# Patient Record
Sex: Male | Born: 1985 | Race: White | Hispanic: No | Smoking: Current every day smoker
Health system: Southern US, Community
[De-identification: ages and names within clinical notes are randomized; demographics above are authoritative.]

## PROBLEM LIST (undated history)

## (undated) DIAGNOSIS — R400 Somnolence: Secondary | ICD-10-CM

## (undated) DIAGNOSIS — G9050 Complex regional pain syndrome I, unspecified: Secondary | ICD-10-CM

## (undated) DIAGNOSIS — R569 Unspecified convulsions: Secondary | ICD-10-CM

---

## 2004-07-20 ENCOUNTER — Emergency Department: Payer: Self-pay | Admitting: General Practice

## 2005-09-26 ENCOUNTER — Emergency Department: Payer: Self-pay | Admitting: Emergency Medicine

## 2007-12-03 ENCOUNTER — Ambulatory Visit: Payer: Self-pay | Admitting: Internal Medicine

## 2009-01-03 ENCOUNTER — Emergency Department: Payer: Self-pay | Admitting: Emergency Medicine

## 2010-01-03 ENCOUNTER — Ambulatory Visit: Payer: Self-pay | Admitting: Pain Medicine

## 2010-01-28 ENCOUNTER — Ambulatory Visit: Payer: Self-pay | Admitting: Pain Medicine

## 2010-02-18 ENCOUNTER — Ambulatory Visit: Payer: Self-pay | Admitting: Pain Medicine

## 2010-03-19 ENCOUNTER — Ambulatory Visit: Payer: Self-pay | Admitting: Family Medicine

## 2010-03-21 ENCOUNTER — Ambulatory Visit: Payer: Self-pay | Admitting: Pain Medicine

## 2013-04-06 ENCOUNTER — Emergency Department: Payer: Self-pay | Admitting: Internal Medicine

## 2013-04-06 LAB — COMPREHENSIVE METABOLIC PANEL
Albumin: 4.2 g/dL (ref 3.4–5.0)
Alkaline Phosphatase: 99 U/L (ref 50–136)
Anion Gap: 7 (ref 7–16)
Calcium, Total: 9.5 mg/dL (ref 8.5–10.1)
Chloride: 103 mmol/L (ref 98–107)
Glucose: 87 mg/dL (ref 65–99)
Osmolality: 276 (ref 275–301)
Potassium: 3.7 mmol/L (ref 3.5–5.1)
SGPT (ALT): 27 U/L (ref 12–78)
Sodium: 137 mmol/L (ref 136–145)

## 2013-04-06 LAB — CBC
HCT: 44.9 % (ref 40.0–52.0)
MCHC: 34.3 g/dL (ref 32.0–36.0)
MCV: 85 fL (ref 80–100)
Platelet: 239 10*3/uL (ref 150–440)
RBC: 5.27 10*6/uL (ref 4.40–5.90)
RDW: 13.8 % (ref 11.5–14.5)
WBC: 14.9 10*3/uL — ABNORMAL HIGH (ref 3.8–10.6)

## 2013-05-19 ENCOUNTER — Emergency Department: Payer: Self-pay | Admitting: Emergency Medicine

## 2013-05-19 LAB — URINALYSIS, COMPLETE
Blood: NEGATIVE
Ketone: NEGATIVE
Nitrite: NEGATIVE
Ph: 7 (ref 4.5–8.0)
WBC UR: NONE SEEN /HPF (ref 0–5)

## 2013-05-19 LAB — DRUG SCREEN, URINE
Amphetamines, Ur Screen: NEGATIVE (ref ?–1000)
Cocaine Metabolite,Ur ~~LOC~~: NEGATIVE (ref ?–300)
Methadone, Ur Screen: NEGATIVE (ref ?–300)
Opiate, Ur Screen: POSITIVE (ref ?–300)
Phencyclidine (PCP) Ur S: NEGATIVE (ref ?–25)
Tricyclic, Ur Screen: NEGATIVE (ref ?–1000)

## 2013-05-19 LAB — COMPREHENSIVE METABOLIC PANEL
Albumin: 3.7 g/dL (ref 3.4–5.0)
Alkaline Phosphatase: 109 U/L (ref 50–136)
Creatinine: 1 mg/dL (ref 0.60–1.30)
Osmolality: 277 (ref 275–301)
SGPT (ALT): 27 U/L (ref 12–78)
Total Protein: 7.3 g/dL (ref 6.4–8.2)

## 2013-05-19 LAB — CBC
HCT: 43 % (ref 40.0–52.0)
HGB: 14.5 g/dL (ref 13.0–18.0)
MCHC: 33.8 g/dL (ref 32.0–36.0)
MCV: 85 fL (ref 80–100)
RBC: 5.05 10*6/uL (ref 4.40–5.90)
RDW: 14.2 % (ref 11.5–14.5)
WBC: 14.6 10*3/uL — ABNORMAL HIGH (ref 3.8–10.6)

## 2013-05-19 LAB — TSH: Thyroid Stimulating Horm: 0.24 u[IU]/mL — ABNORMAL LOW

## 2013-05-19 LAB — ETHANOL: Ethanol: <3 mg/dL

## 2013-11-18 ENCOUNTER — Encounter (HOSPITAL_COMMUNITY): Payer: Self-pay | Admitting: Emergency Medicine

## 2013-11-18 ENCOUNTER — Emergency Department (HOSPITAL_COMMUNITY)
Admission: EM | Admit: 2013-11-18 | Discharge: 2013-11-18 | Disposition: A | Discharge status: Home / Self Care | Payer: Self-pay | Attending: Emergency Medicine | Admitting: Emergency Medicine

## 2013-11-18 DIAGNOSIS — F17200 Nicotine dependence, unspecified, uncomplicated: Secondary | ICD-10-CM | POA: Insufficient documentation

## 2013-11-18 DIAGNOSIS — K0889 Other specified disorders of teeth and supporting structures: Secondary | ICD-10-CM

## 2013-11-18 DIAGNOSIS — IMO0001 Reserved for inherently not codable concepts without codable children: Secondary | ICD-10-CM | POA: Insufficient documentation

## 2013-11-18 DIAGNOSIS — K089 Disorder of teeth and supporting structures, unspecified: Secondary | ICD-10-CM | POA: Insufficient documentation

## 2013-11-18 DIAGNOSIS — Z8669 Personal history of other diseases of the nervous system and sense organs: Secondary | ICD-10-CM | POA: Insufficient documentation

## 2013-11-18 HISTORY — DX: Complex regional pain syndrome I, unspecified: G90.50

## 2013-11-18 MED ORDER — AMOXICILLIN 500 MG PO CAPS: 500.0000 mg | ORAL_CAPSULE | Freq: Three times a day (TID) | ORAL | Status: DC

## 2013-11-18 MED ORDER — MELOXICAM 7.5 MG PO TABS: ORAL_TABLET | ORAL | Status: DC

## 2013-11-18 MED ORDER — ACETAMINOPHEN-CODEINE #3 300-30 MG PO TABS: 1.0000 | ORAL_TABLET | Freq: Four times a day (QID) | ORAL | Status: DC | PRN

## 2013-11-18 NOTE — ED Provider Notes (Signed)
Medical screening examination/treatment/procedure(s) were performed by non-physician practitioner and as supervising physician I was immediately available for consultation/collaboration.   EKG Interpretation None        Dilia Alemany, MD 11/18/13 2324 

## 2013-11-18 NOTE — ED Notes (Signed)
Pt asleep on stretcher.

## 2013-11-18 NOTE — ED Notes (Signed)
Dental pain to right and left sides.

## 2013-11-18 NOTE — ED Provider Notes (Signed)
CSN: [id number]     Arrival date & time 11/18/13  1504 History   First MD Initiated Contact with Patient 11/18/13 1540     Chief Complaint  Patient presents with  . Dental Pain     (Consider location/radiation/quality/duration/timing/severity/associated sxs/prior Treatment) HPI Comments: Patient is a 28 year old male who presents to the emergency department with a complaint of right and left side dental pain. The patient states he has been dealing with this for some time. He has been seen and evaluated by an oral surgeon.he has not been able to get this work done because he does not have Secretary/administratordental insurance for another month. He does have an appointment with an oral surgeon in about a month. He has been treated with antibiotics and a pain medication that he does not remember the name of. He states that the pain medicine did not do any good. He has not had fever.   The history is provided by the patient.    Past Medical History  Diagnosis Date  . RSD (reflex sympathetic dystrophy)    History reviewed. No pertinent past surgical history. No family history on file. History  Substance Use Topics  . Smoking status: Current Every Day Smoker    Types: Cigarettes  . Smokeless tobacco: Not on file  . Alcohol Use: Yes     Comment: occ    Review of Systems  Constitutional: Negative for activity change.       All ROS Neg except as noted in HPI  HENT: Positive for dental problem. Negative for nosebleeds.   Eyes: Negative for photophobia and discharge.  Respiratory: Negative for cough, shortness of breath and wheezing.   Cardiovascular: Negative for chest pain and palpitations.  Gastrointestinal: Negative for abdominal pain and blood in stool.  Genitourinary: Negative for dysuria, frequency and hematuria.  Musculoskeletal: Positive for myalgias. Negative for arthralgias, back pain and neck pain.  Skin: Negative.   Neurological: Negative for dizziness, seizures and speech difficulty.   Psychiatric/Behavioral: Negative for hallucinations and confusion.      Allergies  Review of patient's allergies indicates no known allergies.  Home Medications   Prior to Admission medications   Not on File   BP 178/81  Pulse 96  Temp(Src) 97.9 F (36.6 C) (Oral)  Resp 22  Ht 5\' 11"  (1.803 m)  Wt 205 lb (92.987 kg)  BMI 28.60 kg/m2  SpO2 100% Physical Exam  Nursing note and vitals reviewed. Constitutional: He is oriented to person, place, and time. He appears well-developed and well-nourished.  Non-toxic appearance.  HENT:  Head: Normocephalic.  Right Ear: Tympanic membrane and external ear normal.  Left Ear: Tympanic membrane and external ear normal.  There is pain to percussion of the molars on the right and left. There is no visible abscess appreciated. There is no swelling under the tongue. The airway is patent.  Eyes: EOM and lids are normal. Pupils are equal, round, and reactive to light.  Neck: Normal range of motion. Neck supple. Carotid bruit is not present.  Cardiovascular: Normal rate, regular rhythm, normal heart sounds, intact distal pulses and normal pulses.   Pulmonary/Chest: Breath sounds normal. No respiratory distress.  Abdominal: Soft. Bowel sounds are normal. There is no tenderness. There is no guarding.  Musculoskeletal: Normal range of motion.  Lymphadenopathy:       Head (right side): No submandibular adenopathy present.       Head (left side): No submandibular adenopathy present.    He has no cervical adenopathy.  Neurological: He is alert and oriented to person, place, and time. He has normal strength. No cranial nerve deficit or sensory deficit.  Skin: Skin is warm and dry.  Psychiatric: He has a normal mood and affect. His speech is normal.    ED Course  Procedures (including critical care time) Labs Review Labs Reviewed - No data to display  Imaging Review No results found.   EKG Interpretation None      MDM Patient presents to  the emergency department with complaint of right and left dental pain. No fever reported. The examination does not show any abscess. There is pain however to percussion of the molars on the right and left. The vital signs are well within normal limits. The pulse oximetry is 100% on room air. There is no evidence for Ludwig's angina.  The patient is treated with Amoxil, Mobic, Tylenol codeine. He is advised to see his oral surgeon as soon as possible.   Final diagnoses:  None    *I have reviewed nursing notes, vital signs, and all appropriate lab and imaging results for this patient.Kathie Dike**    Rodrigues Urbanek M Becci Batty, PA-C 11/18/13 [phone]

## 2013-11-18 NOTE — Discharge Instructions (Signed)
Dental Pain °Toothache is pain in or around a tooth. It may get worse with chewing or with cold or heat.  °HOME CARE °· Your dentist may use a numbing medicine during treatment. If so, you may need to avoid eating until the medicine wears off. Ask your dentist about this. °· Only take medicine as told by your dentist or doctor. °· Avoid chewing food near the painful tooth until after all treatment is done. Ask your dentist about this. °GET HELP RIGHT AWAY IF:  °· The problem gets worse or new problems appear. °· You have a fever. °· There is redness and puffiness (swelling) of the face, jaw, or neck. °· You cannot open your mouth. °· There is pain in the jaw. °· There is very bad pain that is not helped by medicine. °MAKE SURE YOU:  °· Understand these instructions. °· Will watch your condition. °· Will get help right away if you are not doing well or get worse. °Document Released: 12/10/2007 Document Revised: 09/15/2011 Document Reviewed: 12/10/2007 °[employer]® Patient Information ©2014 [employer]. ° °

## 2016-02-21 ENCOUNTER — Emergency Department (HOSPITAL_COMMUNITY): Payer: Self-pay

## 2016-02-21 ENCOUNTER — Emergency Department (HOSPITAL_COMMUNITY)
Admission: EM | Admit: 2016-02-21 | Discharge: 2016-02-21 | Disposition: A | Discharge status: Home / Self Care | Payer: Self-pay | Attending: Emergency Medicine | Admitting: Emergency Medicine

## 2016-02-21 ENCOUNTER — Encounter (HOSPITAL_COMMUNITY): Payer: Self-pay

## 2016-02-21 DIAGNOSIS — F17210 Nicotine dependence, cigarettes, uncomplicated: Secondary | ICD-10-CM | POA: Insufficient documentation

## 2016-02-21 DIAGNOSIS — K122 Cellulitis and abscess of mouth: Secondary | ICD-10-CM | POA: Insufficient documentation

## 2016-02-21 DIAGNOSIS — K047 Periapical abscess without sinus: Secondary | ICD-10-CM | POA: Insufficient documentation

## 2016-02-21 DIAGNOSIS — L0390 Cellulitis, unspecified: Secondary | ICD-10-CM

## 2016-02-21 DIAGNOSIS — Z79899 Other long term (current) drug therapy: Secondary | ICD-10-CM | POA: Insufficient documentation

## 2016-02-21 DIAGNOSIS — Z791 Long term (current) use of non-steroidal anti-inflammatories (NSAID): Secondary | ICD-10-CM | POA: Insufficient documentation

## 2016-02-21 DIAGNOSIS — L0291 Cutaneous abscess, unspecified: Secondary | ICD-10-CM

## 2016-02-21 LAB — I-STAT CHEM 8, ED
BUN: 16 mg/dL (ref 6–20)
Calcium, Ion: 1.15 mmol/L (ref 1.13–1.30)
Chloride: 100 mmol/L — ABNORMAL LOW (ref 101–111)
Creatinine, Ser: 1 mg/dL (ref 0.61–1.24)
Glucose, Bld: 91 mg/dL (ref 65–99)
HEMATOCRIT: 44 % (ref 39.0–52.0)
Hemoglobin: 15 g/dL (ref 13.0–17.0)
POTASSIUM: 3.7 mmol/L (ref 3.5–5.1)
Sodium: 139 mmol/L (ref 135–145)
TCO2: 26 mmol/L (ref 0–100)

## 2016-02-21 MED ORDER — IBUPROFEN 800 MG PO TABS: 800.0000 mg | ORAL_TABLET | Freq: Three times a day (TID) | ORAL | 0 refills | Status: DC

## 2016-02-21 MED ORDER — HYDROMORPHONE HCL 1 MG/ML IJ SOLN
1.0000 mg | Freq: Once | INTRAMUSCULAR | Status: AC
Start: 2016-02-21 — End: 2016-02-21
  Administered 2016-02-21: 1 mg via INTRAVENOUS
  Filled 2016-02-21: qty 1

## 2016-02-21 MED ORDER — SODIUM CHLORIDE 0.9 % IV BOLUS (SEPSIS)
1000.0000 mL | Freq: Once | INTRAVENOUS | Status: AC
  Administered 2016-02-21: 1000 mL via INTRAVENOUS

## 2016-02-21 MED ORDER — KETOROLAC TROMETHAMINE 30 MG/ML IJ SOLN
30.0000 mg | Freq: Once | INTRAMUSCULAR | Status: AC
  Administered 2016-02-21: 30 mg via INTRAVENOUS
  Filled 2016-02-21: qty 1

## 2016-02-21 MED ORDER — CLINDAMYCIN HCL 300 MG PO CAPS: 300.0000 mg | ORAL_CAPSULE | Freq: Four times a day (QID) | ORAL | 0 refills | Status: DC

## 2016-02-21 MED ORDER — FENTANYL CITRATE (PF) 100 MCG/2ML IJ SOLN
100.0000 ug | Freq: Once | INTRAMUSCULAR | Status: AC
  Administered 2016-02-21: 100 ug via INTRAVENOUS
  Filled 2016-02-21: qty 2

## 2016-02-21 MED ORDER — CLINDAMYCIN PHOSPHATE 600 MG/50ML IV SOLN
600.0000 mg | Freq: Once | INTRAVENOUS | Status: AC
  Administered 2016-02-21: 600 mg via INTRAVENOUS
  Filled 2016-02-21: qty 50

## 2016-02-21 MED ORDER — CLINDAMYCIN HCL 150 MG PO CAPS
300.0000 mg | ORAL_CAPSULE | Freq: Once | ORAL | Status: AC
  Administered 2016-02-21: 300 mg via ORAL
  Filled 2016-02-21: qty 2

## 2016-02-21 MED ORDER — FENTANYL CITRATE (PF) 100 MCG/2ML IJ SOLN
100.0000 ug | Freq: Once | INTRAMUSCULAR | Status: AC
Start: 2016-02-21 — End: 2016-02-21
  Administered 2016-02-21: 100 ug via INTRAVENOUS
  Filled 2016-02-21: qty 2

## 2016-02-21 MED ORDER — IOPAMIDOL (ISOVUE-300) INJECTION 61%
75.0000 mL | Freq: Once | INTRAVENOUS | Status: AC | PRN
  Administered 2016-02-21: 75 mL via INTRAVENOUS

## 2016-02-21 MED ORDER — BENZOCAINE 20 % MT AERO
INHALATION_SPRAY | Freq: Once | OROMUCOSAL | Status: AC
  Administered 2016-02-21: 18:00:00 via OROMUCOSAL
  Filled 2016-02-21: qty 57

## 2016-02-21 NOTE — ED Triage Notes (Signed)
Left lower dental pain since this morning. Swelling noted to left side of face/neck.

## 2016-02-21 NOTE — ED Notes (Signed)
Pt wants additional pain medication and EDP notified.

## 2016-02-22 ENCOUNTER — Telehealth: Payer: Self-pay | Admitting: *Deleted

## 2016-02-22 NOTE — ED Provider Notes (Signed)
[facility]EM[facility] DEPT Provider Note   CSN: [id number] Arrival date & time: 02/21/16  1619     History   Chief Complaint Chief Complaint  Patient presents with  . Dental Pain    HPI Roberto Glover is a 30 y.o. male.   Dental Pain   This is a recurrent problem. The current episode started more than 2 days ago. The problem occurs constantly. The problem has been gradually worsening. The pain is at a severity of 9/10. The pain is severe. He has tried acetaminophen for the symptoms. The treatment provided mild relief.    Past Medical History:  Diagnosis Date  . RSD (reflex sympathetic dystrophy)     There are no active problems to display for this patient.   History reviewed. No pertinent surgical history.     Home Medications    Prior to Admission medications   Medication Sig Start Date End Date Taking? Authorizing Provider  clindamycin (CLEOCIN) 300 MG capsule Take 1 capsule (300 mg total) by mouth 4 (four) times daily. X 7 days 02/21/16   Marily MemosJason Zylpha Poynor, MD  ibuprofen (ADVIL,MOTRIN) 800 MG tablet Take 1 tablet (800 mg total) by mouth 3 (three) times daily. 02/21/16   Marily MemosJason Elmer Merwin, MD    Family History No family history on file.  Social History Social History  Substance Use Topics  . Smoking status: Current Every Day Smoker    Packs/day: 0.50    Types: Cigarettes  . Smokeless tobacco: Never Used  . Alcohol use Yes     Comment: occ     Allergies   Review of patient's allergies indicates no known allergies.   Review of Systems Review of Systems  Constitutional: Negative for chills and fever.  HENT: Positive for dental problem and facial swelling. Negative for ear pain.   Respiratory: Negative for cough and shortness of breath.   Endocrine: Negative for polydipsia and polyuria.  All other systems reviewed and are negative.    Physical Exam Updated Vital Signs BP 134/89   Pulse 100   Temp 99.4 F (37.4 C) (Oral)   Resp 20   Ht 6' (1.829 m)   Wt 180  lb (81.6 kg)   SpO2 99%   BMI 24.41 kg/m   Physical Exam  Constitutional: He appears well-developed and well-nourished.  HENT:  Head: Normocephalic and atraumatic.  Mouth/Throat: Dental abscesses (around left lower premolars) and dental caries present.    Eyes: Conjunctivae are normal.  Neck: Neck supple.  Cardiovascular: Normal rate and regular rhythm.   No murmur heard. Pulmonary/Chest: Effort normal and breath sounds normal. No respiratory distress.  Abdominal: Soft. There is no tenderness.  Musculoskeletal: He exhibits no edema.  Neurological: He is alert.  Skin: Skin is warm and dry.  Psychiatric: He has a normal mood and affect.  Nursing note and vitals reviewed.    ED Treatments / Results  Labs (all labs ordered are listed, but only abnormal results are displayed) Labs Reviewed  I-STAT CHEM 8, ED - Abnormal; Notable for the following:       Result Value   Chloride 100 (*)    All other components within normal limits    EKG  EKG Interpretation None       Radiology Ct Soft Tissue Neck W Contrast  Result Date: 02/21/2016 CLINICAL DATA:  LEFT lower dental pain beginning this morning, LEFT facial and neck swelling. Evaluate for extent of abscess. History of reflex sympathetic dystrophy. EXAM: CT NECK WITH CONTRAST CT MAXILLOFACIAL WITH  CONTRAST TECHNIQUE: Multidetector CT imaging of the maxillofacial and neck was performed using the standard protocol following the bolus administration of intravenous contrast. CONTRAST:  75mL ISOVUE-300 IOPAMIDOL (ISOVUE-300) INJECTION 61% COMPARISON:  None. FINDINGS: CT NECK: Pharynx and larynx: Prominent palatine tonsils compatible hypertrophy without inflammatory changes. Preservation of parapharyngeal fat tissue planes. Normal appearance of the larynx. Airway widely patent. Salivary glands: Normal. Thyroid: Normal. Lymph nodes: Multiple LEFT level 1B reniform homogeneously enhancing lymph nodes measure up to 10 mm short access  without pathologic lymphadenopathy by CT size criteria. Vascular: Normal. Skeleton: Broad reversed cervical lordosis. No destructive bony lesions. Upper chest: Lung apices are clear. No superior mediastinal lymphadenopathy. CT MAXILLOFACIAL: FACIAL BONES: Periapical lucencies teeth seventeenth, eighteenth, nineteenth (LEFT mandible molars) with multiple dental caries. Additional RIGHT maxillary molar periapical abscess. The mandible is intact, the condyles are located. No acute facial fracture. SINUSES: Paranasal sinuses are well aerated. Nasal septum is midline. ORBITS: Ocular globes and orbital contents are unremarkable. SOFT TISSUES: 5 x 20 x 18 mm (transverse x AP x CC) LEFT buccal rim enhancing fluid collection. Overlying LEFT facial and upper neck soft tissue swelling, an thickened LEFT platysma. No subcutaneous gas or radiopaque foreign bodies. IMPRESSION: CT MAXILLOFACIAL: 5 x 20 x 18 mm LEFT lower facial abscess and cellulitis, likely odontogenic in origin considering subjacent periapical abscess. Generalized poor dentition. CT NECK: Prominent though not pathologically enlarged LEFT level 1 B/submandibular lymph nodes are likely reactive. Symmetric palatine hypertrophy without CT findings of P2 tonsillitis. Electronically Signed   By: Awilda Metro M.D.   On: 02/21/2016 19:43   Ct Maxillofacial W Contrast  Result Date: 02/21/2016 CLINICAL DATA:  LEFT lower dental pain beginning this morning, LEFT facial and neck swelling. Evaluate for extent of abscess. History of reflex sympathetic dystrophy. EXAM: CT NECK WITH CONTRAST CT MAXILLOFACIAL WITH CONTRAST TECHNIQUE: Multidetector CT imaging of the maxillofacial and neck was performed using the standard protocol following the bolus administration of intravenous contrast. CONTRAST:  75mL ISOVUE-300 IOPAMIDOL (ISOVUE-300) INJECTION 61% COMPARISON:  None. FINDINGS: CT NECK: Pharynx and larynx: Prominent palatine tonsils compatible hypertrophy without  inflammatory changes. Preservation of parapharyngeal fat tissue planes. Normal appearance of the larynx. Airway widely patent. Salivary glands: Normal. Thyroid: Normal. Lymph nodes: Multiple LEFT level 1B reniform homogeneously enhancing lymph nodes measure up to 10 mm short access without pathologic lymphadenopathy by CT size criteria. Vascular: Normal. Skeleton: Broad reversed cervical lordosis. No destructive bony lesions. Upper chest: Lung apices are clear. No superior mediastinal lymphadenopathy. CT MAXILLOFACIAL: FACIAL BONES: Periapical lucencies teeth seventeenth, eighteenth, nineteenth (LEFT mandible molars) with multiple dental caries. Additional RIGHT maxillary molar periapical abscess. The mandible is intact, the condyles are located. No acute facial fracture. SINUSES: Paranasal sinuses are well aerated. Nasal septum is midline. ORBITS: Ocular globes and orbital contents are unremarkable. SOFT TISSUES: 5 x 20 x 18 mm (transverse x AP x CC) LEFT buccal rim enhancing fluid collection. Overlying LEFT facial and upper neck soft tissue swelling, an thickened LEFT platysma. No subcutaneous gas or radiopaque foreign bodies. IMPRESSION: CT MAXILLOFACIAL: 5 x 20 x 18 mm LEFT lower facial abscess and cellulitis, likely odontogenic in origin considering subjacent periapical abscess. Generalized poor dentition. CT NECK: Prominent though not pathologically enlarged LEFT level 1 B/submandibular lymph nodes are likely reactive. Symmetric palatine hypertrophy without CT findings of P2 tonsillitis. Electronically Signed   By: Awilda Metro M.D.   On: 02/21/2016 19:43    Procedures .Marland KitchenIncision and Drainage Date/Time: 02/22/2016 12:26 AM Performed by: Marily Memos  Authorized by: Marily MemosMESNER, Nels Munn   Consent:    Consent obtained:  Verbal   Consent given by:  Patient   Risks discussed:  Bleeding, incomplete drainage, pain and infection   Alternatives discussed:  No treatment, delayed treatment, observation and  alternative treatment Location:    Type:  Abscess   Size:  1.5   Location:  Mouth Procedure type:    Complexity:  Simple Procedure details:    Needle aspiration: no     Incision types:  Single straight   Scalpel blade:  11   Wound management:  Irrigated with saline   Drainage:  Bloody and purulent   Drainage amount:  Moderate   Wound treatment:  Wound left open   Packing materials:  None Post-procedure details:    Patient tolerance of procedure:  Tolerated well, no immediate complications   (including critical care time)  Medications Ordered in ED Medications  clindamycin (CLEOCIN) IVPB 600 mg (0 mg Intravenous Stopped 02/21/16 1852)  HYDROmorphone (DILAUDID) injection 1 mg (1 mg Intravenous Given 02/21/16 1822)  Benzocaine (HURRCAINE) 20 % mouth spray ( Mouth/Throat Given 02/21/16 1822)  sodium chloride 0.9 % bolus 1,000 mL (0 mLs Intravenous Stopped 02/21/16 2005)  iopamidol (ISOVUE-300) 61 % injection 75 mL (75 mLs Intravenous Contrast Given 02/21/16 1905)  fentaNYL (SUBLIMAZE) injection 100 mcg (100 mcg Intravenous Given 02/21/16 1948)  ketorolac (TORADOL) 30 MG/ML injection 30 mg (30 mg Intravenous Given 02/21/16 2142)  clindamycin (CLEOCIN) capsule 300 mg (300 mg Oral Given 02/21/16 2142)  fentaNYL (SUBLIMAZE) injection 100 mcg (100 mcg Intravenous Given 02/21/16 2142)     Initial Impression / Assessment and Plan / ED Course  I have reviewed the triage vital signs and the nursing notes.  Pertinent labs & imaging results that were available during my care of the patient were reviewed by me and considered in my medical decision making (see chart for details).  Clinical Course    Dental infection and abscess as above. Also with some cheek/neck swelling so ct done and infection likely extends to neck. I&D done as above. Plan for dentistry follow up in the AM.   Final Clinical Impressions(s) / ED Diagnoses   Final diagnoses:  Cellulitis and abscess  Dental abscess    New  Prescriptions Discharge Medication List as of 02/21/2016  9:38 PM    START taking these medications   Details  clindamycin (CLEOCIN) 300 MG capsule Take 1 capsule (300 mg total) by mouth 4 (four) times daily. X 7 days, Starting Thu 02/21/2016, Print         Marily MemosJason Caylyn Tedeschi, MD 02/22/16 [id number]

## 2017-07-26 ENCOUNTER — Emergency Department (HOSPITAL_COMMUNITY): Payer: Self-pay

## 2017-07-26 ENCOUNTER — Encounter (HOSPITAL_COMMUNITY): Payer: Self-pay | Admitting: Emergency Medicine

## 2017-07-26 ENCOUNTER — Other Ambulatory Visit: Payer: Self-pay

## 2017-07-26 ENCOUNTER — Emergency Department (HOSPITAL_COMMUNITY)
Admission: EM | Admit: 2017-07-26 | Discharge: 2017-07-26 | Disposition: A | Discharge status: Home / Self Care | Payer: Self-pay | Attending: Emergency Medicine | Admitting: Emergency Medicine

## 2017-07-26 DIAGNOSIS — F17210 Nicotine dependence, cigarettes, uncomplicated: Secondary | ICD-10-CM | POA: Insufficient documentation

## 2017-07-26 DIAGNOSIS — R569 Unspecified convulsions: Secondary | ICD-10-CM

## 2017-07-26 HISTORY — DX: Unspecified convulsions: R56.9

## 2017-07-26 LAB — CBC WITH DIFFERENTIAL/PLATELET
BASOS ABS: 0 10*3/uL (ref 0.0–0.1)
Basophils Relative: 0 %
Eosinophils Absolute: 0.3 10*3/uL (ref 0.0–0.7)
Eosinophils Relative: 4 %
HEMATOCRIT: 42.1 % (ref 39.0–52.0)
HEMOGLOBIN: 14.4 g/dL (ref 13.0–17.0)
LYMPHS ABS: 3 10*3/uL (ref 0.7–4.0)
LYMPHS PCT: 32 %
MCH: 29.6 pg (ref 26.0–34.0)
MCHC: 34.2 g/dL (ref 30.0–36.0)
MCV: 86.6 fL (ref 78.0–100.0)
Monocytes Absolute: 0.6 10*3/uL (ref 0.1–1.0)
Monocytes Relative: 6 %
NEUTROS ABS: 5.5 10*3/uL (ref 1.7–7.7)
Neutrophils Relative %: 58 %
Platelets: 213 10*3/uL (ref 150–400)
RBC: 4.86 MIL/uL (ref 4.22–5.81)
RDW: 14.2 % (ref 11.5–15.5)
WBC: 9.5 10*3/uL (ref 4.0–10.5)

## 2017-07-26 LAB — COMPREHENSIVE METABOLIC PANEL
ALK PHOS: 73 U/L (ref 38–126)
ALT: 16 U/L — AB (ref 17–63)
AST: 23 U/L (ref 15–41)
Albumin: 3.6 g/dL (ref 3.5–5.0)
Anion gap: 10 (ref 5–15)
BUN: 11 mg/dL (ref 6–20)
CHLORIDE: 105 mmol/L (ref 101–111)
CO2: 23 mmol/L (ref 22–32)
Calcium: 8.5 mg/dL — ABNORMAL LOW (ref 8.9–10.3)
Creatinine, Ser: 1.14 mg/dL (ref 0.61–1.24)
Glucose, Bld: 115 mg/dL — ABNORMAL HIGH (ref 65–99)
Potassium: 3.5 mmol/L (ref 3.5–5.1)
Sodium: 138 mmol/L (ref 135–145)
Total Bilirubin: 0.5 mg/dL (ref 0.3–1.2)
Total Protein: 6.4 g/dL — ABNORMAL LOW (ref 6.5–8.1)

## 2017-07-26 LAB — CBG MONITORING, ED: GLUCOSE-CAPILLARY: 80 mg/dL (ref 65–99)

## 2017-07-26 LAB — RAPID URINE DRUG SCREEN, HOSP PERFORMED
Amphetamines: NOT DETECTED
Barbiturates: NOT DETECTED
Benzodiazepines: POSITIVE — AB
Cocaine: POSITIVE — AB
OPIATES: POSITIVE — AB
TETRAHYDROCANNABINOL: POSITIVE — AB

## 2017-07-26 LAB — I-STAT TROPONIN, ED: TROPONIN I, POC: 0 ng/mL (ref 0.00–0.08)

## 2017-07-26 LAB — ETHANOL: Alcohol, Ethyl (B): <10 mg/dL (ref <10)

## 2017-07-26 MED ORDER — LEVETIRACETAM 500 MG/5ML IV SOLN
1000.0000 mg | Freq: Once | INTRAVENOUS | Status: AC
  Administered 2017-07-26: 1000 mg via INTRAVENOUS
  Filled 2017-07-26: qty 10

## 2017-07-26 MED ORDER — LORAZEPAM 2 MG/ML IJ SOLN: 1.0000 mg | Freq: Once | INTRAMUSCULAR | Status: DC

## 2017-07-26 MED ORDER — SODIUM CHLORIDE 0.9 % IV SOLN
500.0000 mg | Freq: Once | INTRAVENOUS | Status: AC
  Administered 2017-07-26: 500 mg via INTRAVENOUS
  Filled 2017-07-26: qty 5

## 2017-07-26 MED ORDER — GADOBENATE DIMEGLUMINE 529 MG/ML IV SOLN
15.0000 mL | Freq: Once | INTRAVENOUS | Status: AC | PRN
  Administered 2017-07-26: 15 mL via INTRAVENOUS

## 2017-07-26 MED ORDER — SODIUM CHLORIDE 0.9 % IV BOLUS (SEPSIS)
1000.0000 mL | Freq: Once | INTRAVENOUS | Status: AC
  Administered 2017-07-26: 1000 mL via INTRAVENOUS

## 2017-07-26 MED ORDER — LEVETIRACETAM 750 MG PO TABS: 750.0000 mg | ORAL_TABLET | Freq: Two times a day (BID) | ORAL | 0 refills | Status: DC

## 2017-07-26 NOTE — Consult Note (Signed)
Neurology Consultation Reason for Consult: Seizure Referring Physician: Dr Elesa Massed  CC: Seizure  History is obtained from: Patient  HPI: Roberto Glover is a 32 y.o. male with PMH of seizures. He presents after girlfriend witnessed generalized tonic clonic seizure tonight. Had 1 GCTS 8 days ago witnessed by coworker, and another 2 days ago. Had seizures 6 years ago and was on phenobarbital, but later discontinued it due to drowsiness.  Denies alcohol abuse, admits to using marijuana and took a Percocet yesterday. Was prevously seen by Neurologist in [place], [place] but cannot recall his name.  Patient is back to baseline in ER. Exam shows mild reduced sensation on right arm and leg.  Son has epilepsy, has had multiple concussions.     ROS: A 14 point ROS was performed and is negative except as noted in the HPI.   Past Medical History:  Diagnosis Date  . RSD (reflex sympathetic dystrophy)   . Seizures (HCC)      No family history on file.   Social History:  reports that he has been smoking cigarettes.  He has been smoking about 0.50 packs per day. he has never used smokeless tobacco. He reports that he drinks alcohol. He reports that he does not use drugs.   Exam: Current vital signs: BP (!) 122/93   Pulse 63   Temp 97.8 F (36.6 C) (Axillary)   Resp 20   SpO2 97%  Vital signs in last 24 hours: Temp:  [97.8 F (36.6 C)] 97.8 F (36.6 C) (01/20 0041) Pulse Rate:  [58-77] 63 (01/20 0600) Resp:  [10-20] 20 (01/20 0600) BP: (111-135)/(70-95) 122/93 (01/20 0600) SpO2:  [94 %-100 %] 97 % (01/20 0600)   Physical Exam  Constitutional: Appears well-developed and well-nourished.  Psych: Affect appropriate to situation Eyes: No scleral injection HENT: No OP obstrucion Head: Normocephalic.  Cardiovascular: Normal rate and regular rhythm.  Respiratory: Effort normal, non-labored breathing GI: Soft.  No distension. There is no tenderness.  Skin: WDI  Neuro: Mental  Status: Patient is awake, alert, oriented to person, place, month, year, and situation. Patient is able to give a clear and coherent history. No signs of aphasia or neglect Cranial Nerves: II: Visual Fields are full. Pupils are equal, round, and reactive to light.   III,IV, VI: EOMI without ptosis or diploplia.  V: Facial sensation is symmetric to temperature VII: Facial movement is symmetric.  VIII: hearing is intact to voice X: Uvula elevates symmetrically XI: Shoulder shrug is symmetric. XII: tongue is midline without atrophy or fasciculations.  Motor: Tone is normal. Bulk is normal. 5/5 strength was present in all four extremities.  Sensory: Reduced to temp and light touch over Right arm and leg compared to left  Deep Tendon Reflexes: 2+ and symmetric in the biceps and patellae. Plantars: Toes are downgoing bilaterally.  Cerebellar: FNF and HKS are intact bilaterally      I have reviewed labs in epic and the results pertinent to this consultation are: UDS positive for cocaine, opiates, benzo, THC  I have reviewed the images obtained:CT Head  ASSESSMENT AND  PLAN  Seizure   Has prior seizure history, unknown if they were in setting of substance abuse. Previously on Phenobarb\ UDS positive for cocaine, opiates, benzo, THC this time, patient denies using Cocaine, benzos Will start patient on Keppra 750 mg BID MRI Brain w.w contrast ordered   No driving x 6 months  F/U outpatient Neurology and EEG as outpatient   Per [facility]  statutes, patients with seizures are not allowed to drive until they have been seizure-free for six months. Use caution when using heavy equipment or power tools. Avoid working on ladders or at heights. Take showers instead of baths. Ensure the water temperature is not too high on the home water heater. Do not go swimming alone. Do not lock yourself in a room alone (i.e. bathroom). When caring for infants or small children, sit down when  holding, feeding, or changing them to minimize risk of injury to the child in the event you have a seizure. Maintain good sleep hygiene. Avoid alcohol.    If Roberto Glover has another seizure, call 911 and bring them back to the ED if:       A.  The seizure lasts longer than 5 minutes.            B.  The patient doesn't wake shortly after the seizure or has new problems such as difficulty seeing, speaking or moving following the seizure       C.  The patient was injured during the seizure       D.  The patient has a temperature over 102 F (39C)       E.  The patient vomited during the seizure and now is having trouble breathing

## 2017-07-26 NOTE — Discharge Instructions (Addendum)
As discussed, make sure to take your medication as prescribed and follow-up with neurology.  Seizure precautions -- Please avoid unsupervised activities that might pose danger with sudden loss of consciousness, including bathing, swimming alone, working at heights, and operating heavy machinery. The bathtub is the most common site of seizure-induced drowning, and patients with convulsions should be told to take showers instead of baths.  Driving -- States and countries vary in Engineer, miningdriver licensing requirements for patients with an episode of loss of consciousness, including from seizures and epilepsy, as well as the responsibilities of clinicians to notify state authorities. Most if not all require at least some period of abstinence from driving after a seizure or other event associated with loss or alteration of consciousness. Please do not drive until evaluated by neurologist and until seizure free for at least 6 months.   Stop using drugs, this will affect your seizure activity.

## 2017-07-26 NOTE — ED Provider Notes (Signed)
Patient handed off to me by previous EDPA Clovis RileyMitchell at shift change pending MRI. Please see previous note for full details. Briefly, patient is a 32 year old male with history of seizures previously on phenobarbital. Has not taken phenobarbital in several years and has not had break through seizures since. He has had 3-4 seizures in the last week. On exam by previous provider patient has decreased sensation on right face, arm and leg but strength normal. He denies illicit drug use ETOH abuse.  ED work up so far includes positive opiates, cocaine, benzodiazepines, marijuana in urine. Girlfriend unaware of drug use. Denies ETOH abuse.  On my exam, patient is AxO x 4. No tachycardia, tremors, diaphoresis, nausea. Does not look like withdrawal. Decreased strength  with right hand grip and right dorsiflexion/plantar flexion, question how lack of effort during exam. Also, reports decreased sensation on right face, arm and leg. Dr. Laurence SlateAroor evaluated pt and recommended MRI, pending.  Will attempt to ambulate.  [id number]: Spoke to Dr. Otelia LimesLindzen regarding MRI impression. He reviewed MRI and thinks finding is very subtle. I discussed my physical exam finding of decreased hand grip and dorsiflexion on the right, which was not documented by previous EDP or neurologist and is new per pt. Dr. Otelia LimesLindzen recommending PT evaluation and reassessment in an hour to determine if weakness is improving then likely Todd's paralysis. Will place PT ordered. Discussed plan with patient and is agreeable.  1245: PT evaluated patient. Recommending discharge wo further PT interventions. Gave very clear seizure precautions including no driving for 6 months and until no seizure activity. He has been ambulatory around ED while waiting for PT. No seizure activity in ED for past 12+ hours.     Liberty HandyGibbons, Shuntavia Yerby J, PA-C 07/26/17 1245    Loren RacerYelverton, David, MD 07/26/17 1537

## 2017-07-26 NOTE — ED Provider Notes (Signed)
[facility]ENC[facility] Provider Note   CSN: [id number] Arrival date & time: 07/26/17  0025     History   Chief Complaint Chief Complaint  Patient presents with  . Seizures    HPI Roberto Glover is a 32 y.o. male with past medical history significant for seizure with last seizure 6 years ago presenting via EMS with seizure-like activity witnessed by his girlfriend.  Patient has not seen a neurologist or PCP in 6 years and has discontinued his phenobarbital as he did not like the way it made him feel.  Reports that he has not had any seizures since.  He had a witnessed seizure by a coworker who found him actively seizing with foaming at the mouth and tongue biting 8 days ago.  Patient did not want his friend to call 911 and did not get evaluated.  He then had another episode at work where he was non-responsive with a blank stare momentarily 2 days ago and then had the generalized shaking witnessed by his girlfriend tonight.  He denies any trauma or injury as he was lying down to start watching a movie when it happened.  He denies any loss of bowel or bladder function, no tongue biting.  He currently complains of generalized body aches. He has been experiencing increased headaches and dizzy spells for months which tend to occur when he goes from sitting to standing up and he reports that he feels lightheaded and as if the room was spinning around him.  He also reports a history of multiple concussions with the last one in 2014.   He denies any fever, chills, alcohol or drug use.  He did have a recent upper respiratory infection. Son with epilepsy and increased stress recently.  HPI  Past Medical History:  Diagnosis Date  . RSD (reflex sympathetic dystrophy)   . Seizures (HCC)     There are no active problems to display for this patient.   History reviewed. No pertinent surgical history.     Home Medications    Prior to Admission medications   Medication  Sig Start Date End Date Taking? Authorizing Provider  cetirizine (ZYRTEC) 10 MG tablet Take 10 mg by mouth daily as needed for allergies.   Yes [provider]    Family History No family history on file.  Social History Social History   Tobacco Use  . Smoking status: Current Every Day Smoker    Packs/day: 0.50    Types: Cigarettes  . Smokeless tobacco: Never Used  Substance Use Topics  . Alcohol use: Yes    Comment: occ  . Drug use: No     Allergies   Patient has no known allergies.   Review of Systems Review of Systems  Constitutional: Negative for chills and fever.  HENT: Negative for ear pain, facial swelling, sore throat, tinnitus and trouble swallowing.   Eyes: Negative for photophobia, pain, redness and visual disturbance.  Respiratory: Negative for cough, choking, chest tightness, shortness of breath, wheezing and stridor.   Cardiovascular: Negative for chest pain, palpitations and leg swelling.  Gastrointestinal: Negative for abdominal distention, abdominal pain, diarrhea, nausea and vomiting.  Genitourinary: Negative for difficulty urinating, dysuria, flank pain, frequency and hematuria.  Musculoskeletal: Positive for arthralgias, myalgias and neck pain. Negative for back pain, gait problem, joint swelling and neck stiffness.       Chronic right knee pain  Skin: Negative for color change, pallor, rash and wound.  Neurological: Positive for seizures  and headaches. Negative for dizziness, tremors, syncope, facial asymmetry, speech difficulty, weakness, light-headedness and numbness.     Physical Exam Updated Vital Signs BP 119/81   Pulse 63   Temp 97.8 F (36.6 C) (Axillary)   Resp 12   SpO2 97%   Physical Exam  Constitutional: He is oriented to person, place, and time. He appears well-developed and well-nourished. No distress.  Afebrile, nontoxic-appearing, lying comfortably in bed no acute distress.  HENT:  Head: Normocephalic and atraumatic.    Right Ear: External ear normal.  Left Ear: External ear normal.  Mouth/Throat: Oropharynx is clear and moist. No oropharyngeal exudate.  Patient with slight ecchymosis 2 x 1 cm to the left upper forehead which has been there for a week now.  Eyes: Conjunctivae and EOM are normal. Pupils are equal, round, and reactive to light. Right eye exhibits no discharge. Left eye exhibits no discharge.  Neck: Normal range of motion. Neck supple.  No meningeal signs  Cardiovascular: Normal rate, regular rhythm, normal heart sounds and intact distal pulses.  No murmur heard. Pulmonary/Chest: Effort normal and breath sounds normal. No stridor. No respiratory distress. He has no wheezes. He has no rales. He exhibits no tenderness.  Abdominal: Soft. Bowel sounds are normal. He exhibits no distension. There is no tenderness. There is no rebound and no guarding.  Musculoskeletal: Normal range of motion. He exhibits tenderness. He exhibits no edema or deformity.  Medial aspect of the knee, chronic for a year. tender to palpation of trapezius muscle bilaterally.  No midline tenderness to palpation of the spine  Neurological: He is alert and oriented to person, place, and time. No cranial nerve deficit. He exhibits normal muscle tone. Coordination normal.  Neurologic Exam:  - Mental status: Patient is alert and cooperative. Fluent speech and words are clear. Coherent thought processes and insight is good. Patient is oriented x 4 to person, place, time and event.  - Cranial nerves:  CN III, IV, VI: pupils equally round, reactive to light both direct and conscensual. Full extra-ocular movement. CN VII : muscles of facial expression intact. CN X :  midline uvula. XI strength of sternocleidomastoid and trapezius muscles 5/5, XII: tongue is midline when protruded. - Motor: No involuntary movements. Muscle tone and bulk normal throughout. Muscle strength is 5/5 in bilateral shoulder abduction, elbow flexion and extension,  grip, hip extension, flexion, leg flexion and extension, ankle dorsiflexion and plantar flexion.  - Sensory: Proprioception, light tough sensation intact in all extremities. Patient reporting slightly decreased light touch sensation on the right when compared to the left.  Skin: Skin is warm and dry. Capillary refill takes less than 2 seconds. No rash noted. He is not diaphoretic. No erythema. No pallor.  Psychiatric: He has a normal mood and affect.  Nursing note and vitals reviewed.    ED Treatments / Results  Labs (all labs ordered are listed, but only abnormal results are displayed) Labs Reviewed  COMPREHENSIVE METABOLIC PANEL - Abnormal; Notable for the following components:      Result Value   Glucose, Bld 115 (*)    Calcium 8.5 (*)    Total Protein 6.4 (*)    ALT 16 (*)    All other components within normal limits  RAPID URINE DRUG SCREEN, HOSP PERFORMED - Abnormal; Notable for the following components:   Opiates POSITIVE (*)    Cocaine POSITIVE (*)    Benzodiazepines POSITIVE (*)    Tetrahydrocannabinol POSITIVE (*)    All other components  within normal limits  CBC WITH DIFFERENTIAL/PLATELET  ETHANOL  CBG MONITORING, ED  I-STAT TROPONIN, ED    EKG  EKG Interpretation  Date/Time:  Sunday July 26 2017 00:55:01 EST Ventricular Rate:  77 PR Interval:    QRS Duration: 88 QT Interval:  370 QTC Calculation: 419 R Axis:   81 Text Interpretation:  Sinus rhythm Consider left atrial enlargement ST elev, probable normal early repol pattern No significant change since last tracing Confirmed by Rochele Raring [phone]) on 07/26/2017 1:06:51 AM       Radiology Dg Chest 2 View  Result Date: 07/26/2017 CLINICAL DATA:  Chest pain EXAM: CHEST  2 VIEW COMPARISON:  Chest radiograph 04/06/2013 FINDINGS: The heart size and mediastinal contours are within normal limits. Both lungs are clear. The visualized skeletal structures are unremarkable. IMPRESSION: No active cardiopulmonary  disease. Electronically Signed   By: Deatra Robinson M.D.   On: 07/26/2017 04:17   Ct Head Wo Contrast  Result Date: 07/26/2017 CLINICAL DATA:  Seizure.  Abnormal neurologic exam. EXAM: CT HEAD WITHOUT CONTRAST TECHNIQUE: Contiguous axial images were obtained from the base of the skull through the vertex without intravenous contrast. COMPARISON:  3/24/7 FINDINGS: Brain: No evidence of acute infarction, hemorrhage, hydrocephalus, extra-axial collection or mass lesion/mass effect. Vascular: No hyperdense vessel or unexpected calcification. Skull: Normal. Negative for fracture or focal lesion. Sinuses/Orbits: No acute finding. Other: None. IMPRESSION: 1. Normal brain.  No acute intracranial abnormalities identified. Electronically Signed   By: Signa Kell M.D.   On: 07/26/2017 02:25    Procedures Procedures (including critical care time)  Medications Ordered in ED Medications  sodium chloride 0.9 % bolus 1,000 mL (0 mLs Intravenous Stopped 07/26/17 0306)  levETIRAcetam (KEPPRA) 1,000 mg in sodium chloride 0.9 % 100 mL IVPB (0 mg Intravenous Stopped 07/26/17 0218)  levETIRAcetam (KEPPRA) 500 mg in sodium chloride 0.9 % 100 mL IVPB (0 mg Intravenous Stopped 07/26/17 0600)     Initial Impression / Assessment and Plan / ED Course  I have reviewed the triage vital signs and the nursing notes.  Pertinent labs & imaging results that were available during my care of the patient were reviewed by me and considered in my medical decision making (see chart for details).    Patient presenting via EMS after being convinced by his girlfriend to be evaluated for witnessed seizure at home.  Patient was in bed when this occurred denies injury or trauma. No incontinence or or evidence of tongue biting on exam.   Patient has remote history of seizures and used to be on phenobarbital which he has discontinued 6 years ago due to side effects.  Patient has not been followed by neurology or PCP since.  He denies any  seizures in the last 6 years until 8 days ago.  He has had increased frequency in headaches and seizures over the last 8 days.  Two seizures in the last 3 days including tonight. Denied any alcohol or drug use but lab results show positive opiates, cocaine, benzodiazepines and cannabinoid. Patient reported significant increase in stress levels recently.  Labs otherwise unremarkable  CT head negative  Consulted Neurology and spoke to Dr. Laurence Slate who recommended another 500mg  Keppra loading dose and 750mg  BID keppra at discharge. He will be coming to see patient.  MRI ordered by neurology.  Transferred patient care at end of shift to claudia Gibbons pending MRI and Neurology recommendations.  Final Clinical Impressions(s) / ED Diagnoses   Final diagnoses:  Seizure (HCC)  ED Discharge Orders    None       Gregary Cromer 07/26/17 0756    Loren Racer, MD 08/08/17 [phone]

## 2017-07-26 NOTE — ED Notes (Signed)
Pt stable, ambulatory, and verbalizes understanding of d/c instruction.

## 2017-07-26 NOTE — ED Triage Notes (Signed)
Pt brought in EMS s/p seizure that per EMS pt girlfriend states that he had seizure like activity on and off for approx hour. Pt a&ox4 upon arrival. 16LAC BP 140/88 P82 98%RA

## 2017-07-26 NOTE — ED Notes (Signed)
ED Provider at bedside. 

## 2017-07-26 NOTE — ED Notes (Signed)
Neurologist at bedside speaking with pt.  

## 2017-07-26 NOTE — ED Notes (Signed)
Pt stable, ambulatory, and verbalizes understanding of discharge instructions.  

## 2017-07-26 NOTE — ED Notes (Signed)
Patient transported to MRI 

## 2017-07-26 NOTE — ED Provider Notes (Signed)
MSE was initiated and I personally evaluated the patient and placed orders (if any) at  12:57 AM on July 26, 2017.   32 year old male presents to the emergency department by EMS.  EMS reports seizure-like activity intermittently over the past hour.  I was called to the room by nursing due to patient appearing to go in and out of consciousness.  On my presentation to the room, patient appears mildly confused and anxious.  He is alert to self, but unable to articulate where he is or the city he is in.  He reports a history of seizure activity for which he was previously on phenobarbital.  He notes taking himself off phenobarbital 6 years ago without any seizure activity since.  Patient notes increasing seizure activity over the past 8 days.  He has had 2 seizures in the past 3 days.  Patient denies any recent fever.  He has had mild upper respiratory symptoms and cough productive of phlegm.  Originally, the patient attributed this to tobacco use.  He also notes increased stress.  Patient denies any recent alcohol use or drug use.  He has a history of opioid addiction in his early 5820s which he underwent detox for.  He reports [facility]  This included an abnormal EEG (per patient) after which time he was placed on phenobarbital.  Patient currently complaining of diffuse body aching.  He specifically complains of some chest discomfort which is worse with inspiration.  He has no focal deficits on my neurologic exam.  His speech is clear and he is answering questions appropriately.  No evidence of tongue biting or history of incontinence since increasing seizure-like activity.  Labs as well as fluids ordered for workup.  Chest x-ray ordered in the setting of pleuritic chest pain.  EKG completed by nursing.  Patient may require neurology consultation.  In the setting of seizure history with recurrent seizure episodes, a loading dose of Keppra was  ordered.  The patient's son also with hx of Epilepsy.  The patient appears stable so that the remainder of the MSE may be completed by another provider.   Antony MaduraHumes, Stillman Buenger, PA-C 07/26/17 0108    Antony MaduraHumes, Terrace [place], PA-C 07/26/17 0130    Ward, Layla MawKristen N, DO 07/26/17 [id number]

## 2017-07-26 NOTE — Evaluation (Signed)
Physical Therapy Evaluation Patient Details Name: Roberto Glover MRN: [id number] DOB: January 24, 1986 Today's Date: 07/26/2017   History of Present Illness  Presented to ED 1/20 with witnessed generalized tonic clonic seizure tonight. Reports Rt sided weakness and numbness. PMH-RSD of RUE; seizures; drug use  Clinical Impression   Patient evaluated by Physical Therapy with no further acute PT needs identified. Patient with inconsistent strength testing rt dorsiflexors (ultimately 5/5), however more consistent with decreased grip strength 3+/5. Able to ambulate without device, no foot drop, with antalgic pattern due to h/o rt knee pain. All education has been completed and the patient has no further questions. PT is signing off. Thank you for this referral.     Follow Up Recommendations No PT follow up  (Discussed with pt anticipate resolution of symptoms with time, however if grip remains weak on f/u appt with MD, he can ask for [facility] referral). Pt reports he is familiar with this process as he went for [facility].    Equipment Recommendations  None recommended by PT    Recommendations for Other Services       Precautions / Restrictions Precautions Precautions: None Restrictions Weight Bearing Restrictions: No      Mobility  Bed Mobility Overal bed mobility: Independent                Transfers Overall transfer level: Independent                  Ambulation/Gait Ambulation/Gait assistance: Supervision Ambulation Distance (Feet): 180 Feet Assistive device: None Gait Pattern/deviations: Step-through pattern;Decreased stride length;Antalgic;Wide base of support     General Gait Details: reports antalgic due to rt knee pain (h/o)  Stairs            Wheelchair Mobility    Modified Rankin (Stroke Patients Only)       Balance                                 Standardized Balance Assessment Standardized Balance  Assessment : Berg Balance Test;Dynamic Gait Index Berg Balance Test Sit to Stand: Able to stand without using hands and stabilize independently Standing Unsupported: Able to stand safely 2 minutes Sitting with Back Unsupported but Feet Supported on Floor or Stool: Able to sit safely and securely 2 minutes Stand to Sit: Sits safely with minimal use of hands Transfers: Able to transfer safely, minor use of hands Standing Unsupported with Eyes Closed: Able to stand 10 seconds with supervision(slight posterior bias, but self recovers) Standing Ubsupported with Feet Together: Able to place feet together independently and stand 1 minute safely Standing on One Leg: Tries to lift leg/unable to hold 3 seconds but remains standing independently(standing on RLE; 10 sec on left) Dynamic Gait Index Level Surface: Mild Impairment Change in Gait Speed: Mild Impairment Gait with Horizontal Head Turns: Normal Gait with Vertical Head Turns: Normal Gait and Pivot Turn: Mild Impairment       Pertinent Vitals/Pain Pain Assessment: Faces Faces Pain Scale: Hurts even more Pain Location: RUE Pain Descriptors / Indicators: Aching;Tightness Pain Intervention(s): Limited activity within patient's tolerance;Repositioned    Home Living Family/patient expects to be discharged to:: Private residence Living Arrangements: Spouse/significant other Available Help at Discharge: Family                  Prior Function Level of Independence: Independent         Comments:  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     Hand Dominance   Dominant Hand: Right    Extremity/Trunk Assessment   Upper Extremity Assessment Upper Extremity Assessment: RUE deficits/detail RUE Deficits / Details: grasp weak unalble to maintain grip on PT's 2 fingers; shoulder flexion 3- RUE Sensation: decreased light touch(reports entire rt body decr sensation) RUE Coordination: decreased fine motor;decreased gross motor     Lower Extremity Assessment Lower Extremity Assessment: RLE deficits/detail RLE Deficits / Details: reports chronic knee pain due to torn ligaments therefore unable to tolerate full pressure for 5/5 strength of knee extensors (4/5); ankle DF 5/5 in available ROM; hip flexion 5/5 RLE Sensation: decreased light touch(reports <10% )    Cervical / Trunk Assessment Cervical / Trunk Assessment: Other exceptions Cervical / Trunk Exceptions: reports rt side neck pain (longstanding as part of RSD diagnosis) but currently worse; limits lt rotation to 45 degrees consistently with multiple trials throughout session  Communication   Communication: Other (comment)(slow, slurred)  Cognition Arousal/Alertness: Lethargic Behavior During Therapy: Flat affect Overall Cognitive Status: Within Functional Limits for tasks assessed                                        General Comments General comments (skin integrity, edema, etc.): Educated pt in need to continue to use available ROM neck and rt shoulder going to point of mild stretch.     Exercises     Assessment/Plan    PT Assessment Patent does not need any further PT services  PT Problem List         PT Treatment Interventions      PT Goals (Current goals can be found in the Care Plan section)       Frequency     Barriers to discharge        Co-evaluation               AM-PAC PT "6 Clicks" Daily Activity  Outcome Measure Difficulty turning over in bed (including adjusting bedclothes, sheets and blankets)?: None Difficulty moving from lying on back to sitting on the side of the bed? : None Difficulty sitting down on and standing up from a chair with arms (e.g., wheelchair, bedside commode, etc,.)?: None Help needed moving to and from a bed to chair (including a wheelchair)?: None Help needed walking in hospital room?: None Help needed climbing 3-5 steps with a railing? : None 6 Click Score: 24    End of  Session   Activity Tolerance: No increased pain;Patient tolerated treatment well Patient left: in bed;with family/visitor present Nurse Communication: Mobility status PT Visit Diagnosis: Pain;Muscle weakness (generalized) (M62.81) Pain - Right/Left: Right Pain - part of body: Shoulder    Time: 1610-9604 PT Time Calculation (min) (ACUTE ONLY): 24 min   Charges:   PT Evaluation $PT Eval Low Complexity: 1 Low     PT G Codes:   PT G-Codes **NOT FOR INPATIENT CLASS** Functional Assessment Tool Used: Dynamic Gait Index Functional Limitation: Mobility: Walking and moving around Mobility: Walking and Moving Around Current Status (V4098): At least 1 percent but less than 20 percent impaired, limited or restricted Mobility: Walking and Moving Around Goal Status [phone]): At least 1 percent but less than 20 percent impaired, limited or restricted Mobility: Walking and Moving Around Discharge Status [phone]): At least 1 percent but less than 20 percent impaired, limited or restricted  Zena Amos, PT Pager [phone] 07/26/2017, 12:34 PM

## 2017-07-26 NOTE — ED Notes (Signed)
Pt ambulated in the hall with only stand by assistance.  Pt has a tendency to drag the right foot during ambulation.

## 2017-09-12 ENCOUNTER — Encounter (HOSPITAL_COMMUNITY): Payer: Self-pay | Admitting: Emergency Medicine

## 2017-09-12 ENCOUNTER — Emergency Department (HOSPITAL_COMMUNITY)
Admission: EM | Admit: 2017-09-12 | Discharge: 2017-09-13 | Disposition: A | Discharge status: Home / Self Care | Payer: No Typology Code available for payment source | Attending: Emergency Medicine | Admitting: Emergency Medicine

## 2017-09-12 ENCOUNTER — Emergency Department (HOSPITAL_COMMUNITY): Payer: No Typology Code available for payment source

## 2017-09-12 DIAGNOSIS — R0689 Other abnormalities of breathing: Secondary | ICD-10-CM | POA: Insufficient documentation

## 2017-09-12 DIAGNOSIS — J9811 Atelectasis: Secondary | ICD-10-CM | POA: Insufficient documentation

## 2017-09-12 DIAGNOSIS — F1910 Other psychoactive substance abuse, uncomplicated: Secondary | ICD-10-CM | POA: Diagnosis not present

## 2017-09-12 DIAGNOSIS — F17210 Nicotine dependence, cigarettes, uncomplicated: Secondary | ICD-10-CM | POA: Diagnosis not present

## 2017-09-12 DIAGNOSIS — R55 Syncope and collapse: Secondary | ICD-10-CM | POA: Diagnosis present

## 2017-09-12 LAB — CBC WITH DIFFERENTIAL/PLATELET
Basophils Absolute: 0 10*3/uL (ref 0.0–0.1)
Basophils Relative: 1 %
EOS ABS: 0.1 10*3/uL (ref 0.0–0.7)
EOS PCT: 1 %
HCT: 44.3 % (ref 39.0–52.0)
HEMOGLOBIN: 15.1 g/dL (ref 13.0–17.0)
LYMPHS ABS: 2.4 10*3/uL (ref 0.7–4.0)
LYMPHS PCT: 28 %
MCH: 29.3 pg (ref 26.0–34.0)
MCHC: 34.1 g/dL (ref 30.0–36.0)
MCV: 85.9 fL (ref 78.0–100.0)
Monocytes Absolute: 0.7 10*3/uL (ref 0.1–1.0)
Monocytes Relative: 7 %
Neutro Abs: 5.5 10*3/uL (ref 1.7–7.7)
Neutrophils Relative %: 63 %
PLATELETS: 257 10*3/uL (ref 150–400)
RBC: 5.16 MIL/uL (ref 4.22–5.81)
RDW: 14.2 % (ref 11.5–15.5)
WBC: 8.7 10*3/uL (ref 4.0–10.5)

## 2017-09-12 MED ORDER — IOPAMIDOL (ISOVUE-300) INJECTION 61%
INTRAVENOUS | Status: AC
  Administered 2017-09-13: 100 mL
  Filled 2017-09-12: qty 100

## 2017-09-12 MED ORDER — NALOXONE HCL 0.4 MG/ML IJ SOLN
0.0400 mg | Freq: Once | INTRAMUSCULAR | Status: AC
  Administered 2017-09-12: 0.04 mg via INTRAVENOUS
  Filled 2017-09-12: qty 1
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
## 2017-09-13 LAB — COMPREHENSIVE METABOLIC PANEL
ALBUMIN: 3.8 g/dL (ref 3.5–5.0)
ALT: 11 U/L — AB (ref 17–63)
AST: 15 U/L (ref 15–41)
Alkaline Phosphatase: 69 U/L (ref 38–126)
Anion gap: 11 (ref 5–15)
BUN: 16 mg/dL (ref 6–20)
CHLORIDE: 103 mmol/L (ref 101–111)
CO2: 21 mmol/L — AB (ref 22–32)
CREATININE: 1.1 mg/dL (ref 0.61–1.24)
Calcium: 9.7 mg/dL (ref 8.9–10.3)
GFR calc Af Amer: >60 mL/min (ref >60)
GFR calc non Af Amer: >60 mL/min (ref >60)
Glucose, Bld: 106 mg/dL — ABNORMAL HIGH (ref 65–99)
Potassium: 4.2 mmol/L (ref 3.5–5.1)
SODIUM: 135 mmol/L (ref 135–145)
Total Bilirubin: 0.8 mg/dL (ref 0.3–1.2)
Total Protein: 6.6 g/dL (ref 6.5–8.1)

## 2017-09-13 LAB — ETHANOL

## 2017-09-13 LAB — I-STAT TROPONIN, ED: Troponin i, poc: 0 ng/mL (ref 0.00–0.08)

## 2017-09-13 LAB — ACETAMINOPHEN LEVEL: Acetaminophen (Tylenol), Serum: <10 ug/mL — ABNORMAL LOW (ref 10–30)

## 2017-09-13 LAB — SALICYLATE LEVEL: Salicylate Lvl: <7 mg/dL (ref 2.8–30.0)

## 2017-09-13 LAB — CBG MONITORING, ED: GLUCOSE-CAPILLARY: 89 mg/dL (ref 65–99)

## 2017-09-13 MED ORDER — NALOXONE HCL 0.4 MG/ML IJ SOLN: 0.2000 mg | Freq: Once | INTRAMUSCULAR | Status: DC

## 2017-09-13 MED ORDER — NALOXONE HCL 2 MG/2ML IJ SOSY
2.0000 mg | PREFILLED_SYRINGE | Freq: Once | INTRAMUSCULAR | Status: AC
  Administered 2017-09-13: 2 mg via INTRAVENOUS

## 2017-09-13 MED ORDER — SODIUM CHLORIDE 0.9 % IV BOLUS (SEPSIS)
1000.0000 mL | Freq: Once | INTRAVENOUS | Status: AC
  Administered 2017-09-13: 1000 mL via INTRAVENOUS

## 2017-09-13 MED ORDER — NALOXONE HCL 2 MG/2ML IJ SOSY
PREFILLED_SYRINGE | INTRAMUSCULAR | Status: AC
  Filled 2017-09-13: qty 2

## 2017-09-13 NOTE — ED Notes (Signed)
Pt ambulated in hallway w/ some assistance. C/o pain & dizziness, but showing NAD. RR even and unlabored. A&O x3. Given po fluids & crackers. Tolerating all well.

## 2017-09-13 NOTE — ED Notes (Signed)
Warm blankets placed on pt due to core temp.

## 2017-09-13 NOTE — Discharge Instructions (Signed)
Take 4 over the counter ibuprofen tablets 3 times a day or 2 over-the-counter naproxen tablets twice a day for pain. Also take tylenol 1000mg(2 extra strength) four times a day.    

## 2017-10-10 ENCOUNTER — Emergency Department: Payer: Self-pay

## 2017-10-10 ENCOUNTER — Inpatient Hospital Stay
Admit: 2017-10-10 | Discharge: 2017-10-12 | Disposition: A | Discharge status: Home / Self Care | Payer: Self-pay | Attending: Internal Medicine | Admitting: Internal Medicine

## 2017-10-10 ENCOUNTER — Inpatient Hospital Stay: Admit: 2017-10-10 | Payer: Self-pay

## 2017-10-10 ENCOUNTER — Emergency Department: Admit: 2017-10-10 | Payer: Self-pay

## 2017-10-10 ENCOUNTER — Inpatient Hospital Stay

## 2017-10-10 DIAGNOSIS — T50901A Poisoning by unspecified drugs, medicaments and biological substances, accidental (unintentional), initial encounter: Secondary | ICD-10-CM

## 2017-10-10 LAB — CBC WITH AUTOMATED DIFF
ABS. BASOPHILS: 0.1 10*3/uL (ref 0.0–0.1)
ABS. EOSINOPHILS: 0.2 10*3/uL (ref 0.0–0.4)
ABS. IMM. GRANS.: 0.2 10*3/uL — ABNORMAL HIGH (ref 0.00–0.04)
ABS. LYMPHOCYTES: 4 10*3/uL — ABNORMAL HIGH (ref 0.8–3.5)
ABS. MONOCYTES: 0.5 10*3/uL (ref 0.0–1.0)
ABS. NEUTROPHILS: 5.7 10*3/uL (ref 1.8–8.0)
ABSOLUTE NRBC: 0 10*3/uL (ref 0.00–0.01)
BASOPHILS: 1 % (ref 0–1)
EOSINOPHILS: 2 % (ref 0–7)
HCT: 45.4 % (ref 36.6–50.3)
HGB: 14.7 g/dL (ref 12.1–17.0)
IMMATURE GRANULOCYTES: 2 % — ABNORMAL HIGH (ref 0.0–0.5)
LYMPHOCYTES: 38 % (ref 12–49)
MCH: 29.2 PG (ref 26.0–34.0)
MCHC: 32.4 g/dL (ref 30.0–36.5)
MCV: 90.3 FL (ref 80.0–99.0)
MONOCYTES: 5 % (ref 5–13)
MPV: 10.8 FL (ref 8.9–12.9)
NEUTROPHILS: 52 % (ref 32–75)
NRBC: 0 PER 100 WBC
PLATELET: 211 10*3/uL (ref 150–400)
RBC: 5.03 M/uL (ref 4.10–5.70)
RDW: 14.9 % — ABNORMAL HIGH (ref 11.5–14.5)
WBC: 10.7 10*3/uL (ref 4.1–11.1)

## 2017-10-10 LAB — POC G3 - PUL
Base excess (POC): 0 mmol/L
Flow rate (POC): 2 L/min
HCO3 (POC): 25.9 MMOL/L (ref 22–26)
pCO2 (POC): 47.2 MMHG — ABNORMAL HIGH (ref 35.0–45.0)
pH (POC): 7.346 — ABNORMAL LOW (ref 7.35–7.45)
pO2 (POC): 125 MMHG — ABNORMAL HIGH (ref 80–100)
sO2 (POC): 99 % — ABNORMAL HIGH (ref 92–97)

## 2017-10-10 LAB — URINALYSIS W/ RFLX MICROSCOPIC
Bilirubin: NEGATIVE
Blood: NEGATIVE
Glucose: NEGATIVE mg/dL
Ketone: NEGATIVE mg/dL
Leukocyte Esterase: NEGATIVE
Nitrites: NEGATIVE
Protein: NEGATIVE mg/dL
Specific gravity: 1.01 (ref 1.003–1.030)
Urobilinogen: 0.2 EU/dL (ref 0.2–1.0)
pH (UA): 6 (ref 5.0–8.0)

## 2017-10-10 LAB — METABOLIC PANEL, COMPREHENSIVE
A-G Ratio: 1.2 (ref 1.1–2.2)
ALT (SGPT): 55 U/L (ref 12–78)
AST (SGOT): 52 U/L — ABNORMAL HIGH (ref 15–37)
Albumin: 3.8 g/dL (ref 3.5–5.0)
Alk. phosphatase: 75 U/L (ref 45–117)
Anion gap: 4 mmol/L — ABNORMAL LOW (ref 5–15)
BUN/Creatinine ratio: 13 (ref 12–20)
BUN: 17 MG/DL (ref 6–20)
Bilirubin, total: 0.2 MG/DL (ref 0.2–1.0)
CO2: 30 mmol/L (ref 21–32)
Calcium: 8.7 MG/DL (ref 8.5–10.1)
Chloride: 104 mmol/L (ref 97–108)
Creatinine: 1.28 MG/DL (ref 0.70–1.30)
GFR est AA: >60 mL/min/{1.73_m2} (ref >60)
GFR est non-AA: >60 mL/min/{1.73_m2} (ref >60)
Globulin: 3.3 g/dL (ref 2.0–4.0)
Glucose: 154 mg/dL — ABNORMAL HIGH (ref 65–100)
Potassium: 3.6 mmol/L (ref 3.5–5.1)
Protein, total: 7.1 g/dL (ref 6.4–8.2)
Sodium: 138 mmol/L (ref 136–145)

## 2017-10-10 LAB — EKG, 12 LEAD, INITIAL
Atrial Rate: 99 {beats}/min
Calculated P Axis: 68 degrees
Calculated R Axis: 47 degrees
Calculated T Axis: 21 degrees
Diagnosis: NORMAL
P-R Interval: 172 ms
Q-T Interval: 342 ms
QRS Duration: 96 ms
QTC Calculation (Bezet): 438 ms
Ventricular Rate: 99 {beats}/min

## 2017-10-10 LAB — DRUG SCREEN, URINE
AMPHETAMINES: NEGATIVE
BARBITURATES: NEGATIVE
BENZODIAZEPINES: POSITIVE — AB
COCAINE: NEGATIVE
METHADONE: NEGATIVE
OPIATES: POSITIVE — AB
PCP(PHENCYCLIDINE): NEGATIVE
THC (TH-CANNABINOL): NEGATIVE

## 2017-10-10 LAB — SAMPLES BEING HELD

## 2017-10-10 LAB — URINE CULTURE HOLD SAMPLE

## 2017-10-10 LAB — MAGNESIUM: Magnesium: 2.3 mg/dL (ref 1.6–2.4)

## 2017-10-10 LAB — TSH 3RD GENERATION: TSH: 4.7 u[IU]/mL — ABNORMAL HIGH (ref 0.36–3.74)

## 2017-10-10 LAB — ETHYL ALCOHOL: ALCOHOL(ETHYL),SERUM: <10 MG/DL (ref <10)

## 2017-10-10 LAB — EKG 12-LEAD
Atrial Rate: 99 {beats}/min
Diagnosis: NORMAL
P Axis: 68 degrees
P-R Interval: 172 ms
Q-T Interval: 342 ms
QRS Duration: 96 ms
QTc Calculation (Bazett): 438 ms
R Axis: 47 degrees
T Axis: 21 degrees
Ventricular Rate: 99 {beats}/min

## 2017-10-10 MED ORDER — ONDANSETRON (PF) 4 MG/2 ML INJECTION
4 mg/2 mL | Freq: Four times a day (QID) | INTRAMUSCULAR | Status: DC | PRN
Start: 2017-10-10 — End: 2017-10-12

## 2017-10-10 MED ORDER — SODIUM CHLORIDE 0.9 % IV
5005 mg/5 mL | INTRAVENOUS | Status: AC
Start: 2017-10-10 — End: 2017-10-10
  Administered 2017-10-10: 13:00:00 via INTRAVENOUS

## 2017-10-10 MED ORDER — NALOXONE 1 MG/ML IJ SYRG(AKA NARCAN)
1 mg/mL | INTRAMUSCULAR | Status: AC
Start: 2017-10-10 — End: 2017-10-10

## 2017-10-10 MED ORDER — SODIUM CHLORIDE 0.9 % IV
5005 mg/5 mL | Freq: Two times a day (BID) | INTRAVENOUS | Status: DC
Start: 2017-10-10 — End: 2017-10-12
  Administered 2017-10-11 – 2017-10-12 (×4): via INTRAVENOUS

## 2017-10-10 MED ORDER — NALOXONE 1 MG/ML IJ SYRG(AKA NARCAN)
1 mg/mL | INTRAMUSCULAR | Status: AC
Start: 2017-10-10 — End: 2017-10-10
  Administered 2017-10-10: 13:00:00 via INTRAVENOUS

## 2017-10-10 MED ORDER — SODIUM CHLORIDE 0.9 % IJ SYRG
Freq: Three times a day (TID) | INTRAMUSCULAR | Status: DC
Start: 2017-10-10 — End: 2017-10-12
  Administered 2017-10-11 – 2017-10-12 (×5): via INTRAVENOUS

## 2017-10-10 MED ORDER — DEXTROSE 5% IN NORMAL SALINE IV
INTRAVENOUS | Status: DC
Start: 2017-10-10 — End: 2017-10-12
  Administered 2017-10-10 – 2017-10-11 (×5): via INTRAVENOUS

## 2017-10-10 MED ORDER — SODIUM CHLORIDE 0.9 % IJ SYRG
INTRAMUSCULAR | Status: DC | PRN
Start: 2017-10-10 — End: 2017-10-12
  Administered 2017-10-12: 12:00:00 via INTRAVENOUS

## 2017-10-10 MED ORDER — SODIUM CHLORIDE 0.9% BOLUS IV
0.9 % | INTRAVENOUS | Status: AC
Start: 2017-10-10 — End: 2017-10-10
  Administered 2017-10-10: 12:00:00 via INTRAVENOUS

## 2017-10-10 MED ORDER — ACETAMINOPHEN 325 MG TABLET
325 mg | ORAL | Status: DC | PRN
Start: 2017-10-10 — End: 2017-10-12
  Administered 2017-10-11 – 2017-10-12 (×4): via ORAL

## 2017-10-10 MED FILL — LEVETIRACETAM 500 MG/5 ML IV SOLN: 500 mg/5 mL | INTRAVENOUS | Qty: 5

## 2017-10-10 MED FILL — DEXTROSE 5% IN NORMAL SALINE IV: INTRAVENOUS | Qty: 1000

## 2017-10-10 MED FILL — SODIUM CHLORIDE 0.9 % IV: INTRAVENOUS | Qty: 1000

## 2017-10-10 MED FILL — MONOJECT PREFILL ADVANCED 0.9 % SODIUM CHLORIDE INJECTION SYRINGE: INTRAMUSCULAR | Qty: 40

## 2017-10-10 MED FILL — NALOXONE 1 MG/ML IJ SYRG(AKA NARCAN): 1 mg/mL | INTRAMUSCULAR | Qty: 2

## 2017-10-10 MED FILL — LEVETIRACETAM 500 MG/5 ML IV SOLN: 500 mg/5 mL | INTRAVENOUS | Qty: 10

## 2017-10-10 NOTE — Consults (Signed)
[facility] [facility]  CONSULTATION    Name:  William [name], William  MR#:  [id number]  DOB:  [birth year]  ACCOUNT #:  [id number]  DATE OF SERVICE:  10/10/2017    REQUESTING PHYSICIAN:  Dr. Glenetta HewMcIntosh    REASON FOR EVALUATION:  Altered mental status.    HISTORY OF PRESENT ILLNESS:  The patient is a 32 year old male with no clear known past medical history, who is a travel Corporate treasurerbasketball referee.  He was here in town with his friend and they were staying in a hotel.  His friend apparently found him on the bathroom floor, unresponsive.  He reportedly snorted heroin this morning.  Friend gave him 4 mg of Narcan and EMS were called.  When they arrived, they gave him more Narcan.  He became somewhat responsive and was answering questions upon arrival in the Emergency Department, but has remained lethargic for the most part.  There was no witnessed seizure-like event, but there is reported history of seizures in the past.  He takes Keppra and last dose of Keppra was yesterday morning.  There was seizure about 2 months ago and there was a report of him overdosing himself on drugs back then as well.  Currently, he is quite sleepy and is unable to obtain a detailed review of systems.    PAST MEDICAL HISTORY:  As mentioned above.    ALLERGIES:  NONE.    SOCIAL HISTORY:  [place] and there is drug use involved.  No known history of alcohol or smoking.    FAMILY HISTORY:  Noncontributory.    REVIEW OF SYSTEMS:  Difficult to obtain.    PHYSICAL EXAMINATION:  GENERAL:  The patient is lying in bed, in no acute distress.  VITAL SIGNS:  Blood pressure 106/51, temperature 98.6, pulse is 79.  HEENT:  Pupils are equal, reactive.  He opens his eyes to command, but drifts back to sleep.  Tries to squeeze hands, but not fully.  Was moving his extremities earlier today per staff.  Strength testing was difficult.  Deep tendon reflexes hypoactive.  Toes downgoing.  Sensory, cerebellar, and gait exam was deferred.   HEART:  Regular rate and rhythm.  CHEST:  Clear.  ABDOMEN:  Soft, nontender.  Positive bowel sounds.  EXTREMITIES:  No edema.    LABORATORY DATA:  CBC and chemistry is unremarkable.  CT scan of the brain did not show any acute abnormalities.    ASSESSMENT AND PLAN:  A 32 year old male with reported history of seizures, who is admitted with altered mental status.  I suspect that he had a seizure possibly related to heroin use and question benzodiazepine withdrawal/noncompliance with the anticonvulsant.  Continue Keppra 500 mg twice daily.  We will check an EEG.  Continue with supportive care and I will reassess once he is more awake.    Thank you for this consultation.        Lance BoschAMANDEEP S Tianah Lonardo, MD      [id number]  D:  10/10/2017 13:42  T:  10/10/2017 15:04  JOB #:  [id number]

## 2017-10-10 NOTE — ED Notes (Addendum)
Assumed care of patient at this time.  SBAR report received from MacaoSamara RN and Ami Charity fundraiserN. Patient resting with eyes closed, responds to voice. A&Ox4, speech clear    7:50 AM Patient log rolled with assistance of this RN and ER PCT. Skin intact. No redness noted on back. Patient reports last being in bathroom about to shower, unsure of exact time. Bruise noted to left forehead. Pupils round, responsive to light bilaterally.    7:55 AM  HOB elevated, patient placed on 1L supplemental O2 via NC- spO2 99%. Patient given pillow to assist with airway alignment. No sign of distress noted. Patient's room within view of nurse's station.    7:58 AM  Two [employer] officers at bedside.    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 Patient reports last taking Keppra Friday morning. Patient states, "I think I had a seizure when going to take a shower". Patient admits to drug use. Cut noted on right tongue. Seizure precautions applied.    8:49 AM  Patient given personal cell phone by Doneta PublicFriend, Ryan Wood. Patient's friend travels with patient for baseball game referee jobs. Patient's friend reports patient's frequent abuse of heroin and xanax.    9:00 AM  Patient becoming more difficult to arouse. Sternal rub utilized, Lowell GuitarPowell MD notified. SpO2 96% and higher on 1L supplemental O2 via NC.    9:30 AM  Patient responsive to pain, Lowell GuitarPowell MD aware. XR reports being unable to perform scans at this time due to patient's inability to stand or log roll himself at this time.    10:32 AM   Patient minimally responsive to straight catheterization performed by this RN and Josh, ER PCT.    10:37 AM  Mc-Intosh MD at bedside for initial admission assessment. Verbal orders received for stat ABG, RT paged.    12:02 PM  Patient resting in stretcher with eyes closed. Patient responsive to pain, VSS. Patient remains on 2L supplemental O2 via NC- spO2 96% and higher.  Patient belongings placed in personal belongings bag: 1 pair of jeans, cell phone, NC ID.
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
## 2017-10-10 NOTE — H&P (Signed)
Job # [id number]

## 2017-10-10 NOTE — Progress Notes (Signed)
1945: Bedside shift change report given to Lelon MastSamantha, Charity fundraiserN (Cabin crewoncoming nurse) by Clovis FredricksonLarissa, Charity fundraiserN (offgoing nurse). Report included the following information SBAR, Kardex, Procedure Summary, Intake/Output, MAR, Accordion, Recent Results, Med Rec Status and Cardiac Rhythm NSR .  2000: Assumed care of patient sleeping in bed; easily awoken. SAD score completed with score of 7; sitter required. Pt having periods of apnea and RR 5-8. Paged hospitalist.   2052: Spoke to Dr. Bradly BienenstockMartinez and updated on patient status. Orders received for PRN narcan based on pharmacy recommendations, a sitter, and ok to advance diet as patient becomes more alert.   2100: Spoke with Kendell BaneHayoung Moon, RPH in regards to narcan recommendations and orders placed per Dr. Bradly BienenstockMartinez request.   2115: Sitter arrived to unit. Dysphagia screen completed and passed.   2200: Pt OOB, using urinal (900 ml clear yellow urine).   0700: Uneventful shift. VSS.   0750: Bedside shift change report given to PolandLarissa, Charity fundraiserN (Cabin crewoncoming nurse) by Lelon MastSamantha, RN (offgoing nurse). Report included the following information SBAR, Kardex, Procedure Summary, Intake/Output, MAR, Accordion, Recent Results, Med Rec Status and Cardiac Rhythm NSR.       Problem: Suicide/Homicide (Adult/Pediatric)  Goal: *STG: Remains safe in hospital  Outcome: Progressing Towards Goal  Note:   Sitter at bedside  Goal: *STG: Seeks staff when feelings of self harm or harm towards others arise  Outcome: Progressing Towards Goal  Note:   No current complaints of self harm     Problem: Breathing Pattern - Ineffective  Goal: *Absence of hypoxia  Outcome: Progressing Towards Goal  Goal: *Use of effective breathing techniques  Outcome: Progressing Towards Goal     Problem: Falls - Risk of  Goal: *Absence of Falls  Description  Document Bridgette HabermannSchmid Fall Risk and appropriate interventions in the flowsheet.  Outcome: Progressing Towards Goal     Problem: Pressure Injury - Risk of  Goal: *Prevention of pressure injury  Description   Document Braden Scale and appropriate interventions in the flowsheet.  Outcome: Progressing Towards Goal

## 2017-10-10 NOTE — Other (Signed)
[facility] Note    Patient is being admitted to ICU and is currently minimally responsive.  [employer] consult was ordered but will be discontinued due to medical admission.  A consult to psychiatry can be placed by the hospitalist whenever it is deemed appropriate.    Kathryne HitchJennifer A Sydow

## 2017-10-10 NOTE — Progress Notes (Signed)
Bedside shift change report given to Samantha, RN (oncoming nurse) by Blondell Laperle,RN (offgoing nurse). Report included the following information SBAR, Kardex, MAR and Recent Results.

## 2017-10-10 NOTE — ED Provider Notes (Signed)
32 y.o. male with no significant past medical history who presents from [facility] with chief complaint of overdose. Pt states he snorted heroin this morning, but doesn't remember when. Last thing the patient remembers is getting in the shower. Roommate found the patient laying on the ground in the bathroom and gave him 4 of narcan. EMS arrived and gave another 4 of narcan. Pt is responsive and answering questions upon arrival. 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 Pt states he has a history of seizures and takes Keppra for it. Pt states his last does of Keppra was yesterday morning. Pt's last seizure was 1.5 months ago. Pt denies any other medical history or daily medications. Pt c/o neck pain and lower back pain. There are no other acute medical concerns at this time.  Social hx: Current smoker. Heroin use.     Note written by Norma Fredrickson, scribe, as dictated by Lovey Newcomer, MD 7:42 AM      The history is provided by the EMS personnel and the patient. No language interpreter was used.        No past medical history on file.    No past surgical history on file.      No family history on file.    Social History     Socioeconomic History   ??? Marital status: Not on file     Spouse name: Not on file   ??? Number of children: Not on file   ??? Years of education: Not on file   ??? Highest education level: Not on file   Occupational History   ??? Not on file   Social Needs   ??? Financial resource strain: Not on file   ??? Food insecurity:     Worry: Not on file     Inability: Not on file   ??? Transportation needs:     Medical: Not on file     Non-medical: Not on file   Tobacco Use   ??? Smoking status: Not on file   Substance and Sexual Activity   ??? Alcohol use: Not on file   ??? Drug use: Not on file   ??? Sexual activity: Not on file   Lifestyle   ??? Physical activity:     Days per week: Not on file     Minutes per session: Not on file   ??? Stress: Not on file   Relationships    ??? Social connections:     Talks on phone: Not on file     Gets together: Not on file     Attends religious service: Not on file     Active member of club or organization: Not on file     Attends meetings of clubs or organizations: Not on file     Relationship status: Not on file   ??? Intimate partner violence:     Fear of current or ex partner: Not on file     Emotionally abused: Not on file     Physically abused: Not on file     Forced sexual activity: Not on file   Other Topics Concern   ??? Not on file   Social History Narrative   ??? Not on file         ALLERGIES: Patient has no known allergies.    Review of Systems   Constitutional: Negative for activity change, appetite change and fatigue.        +  Lethargic   HENT: Negative for ear pain, facial swelling, sore throat and trouble swallowing.    Eyes: Negative for pain, discharge and visual disturbance.   Respiratory: Negative for chest tightness, shortness of breath and wheezing.    Cardiovascular: Positive for palpitations. Negative for chest pain.   Gastrointestinal: Negative for abdominal pain, blood in stool, nausea and vomiting.   Genitourinary: Negative for difficulty urinating, flank pain and hematuria.   Musculoskeletal: Positive for back pain (low) and neck pain. Negative for arthralgias, joint swelling and myalgias.   Skin: Positive for wound (hematoma). Negative for color change and rash.   Neurological: Negative for dizziness, weakness, numbness and headaches.   Hematological: Negative for adenopathy. Does not bruise/bleed easily.   Psychiatric/Behavioral: Negative for behavioral problems, confusion and sleep disturbance.   All other systems reviewed and are negative.      Vitals:    10/10/17 0735 10/10/17 0745   BP: 137/86 (!) 139/92   Pulse: (!) 104 (!) 101   Resp: 16 13   Temp: 97.7 ??F (36.5 ??C)    SpO2: 100% 96%            Physical Exam   Constitutional: He is oriented to person, place, and time. He appears  well-developed and well-nourished. No distress.   Vital signs normal.    HENT:   Head: Normocephalic and atraumatic.   Nose: Nose normal.   Mouth/Throat: Oropharynx is clear and moist.   Cut on right side of tongue.   Left sided hematoma on the head.    Eyes: Pupils are equal, round, and reactive to light. Conjunctivae and EOM are normal. No scleral icterus.   Neck: Normal range of motion. Neck supple. No JVD present. No tracheal deviation present. No thyromegaly present.   No carotid bruits noted.   Cardiovascular: Regular rhythm, normal heart sounds and intact distal pulses. Tachycardia present. Exam reveals no gallop and no friction rub.   No murmur heard.  Cardiac monitor normal.   Slight tachycardia.    Pulmonary/Chest: Effort normal and breath sounds normal. No respiratory distress. He has no wheezes. He has no rales. He exhibits no tenderness.   Abdominal: Soft. Bowel sounds are normal. He exhibits no distension and no mass. There is no tenderness. There is no rebound and no guarding.   Musculoskeletal: Normal range of motion. He exhibits no edema or tenderness.   Lymphadenopathy:     He has no cervical adenopathy.   Neurological: He is alert and oriented to person, place, and time. He has normal reflexes. No cranial nerve deficit. Coordination normal.   Skin: Skin is warm and dry. No rash noted. No erythema.   Psychiatric: He has a normal mood and affect. His behavior is normal. Judgment and thought content normal.   Nursing note and vitals reviewed.     Note written by Norma FredricksonJoseph D. McEachon, scribe, as dictated by Lovey NewcomerPowell, Charnika Herbst G, MD 7:42 AM    MDM  Number of Diagnoses or Management Options  Somnolence: new and requires workup     Amount and/or Complexity of Data Reviewed  Clinical lab tests: ordered and reviewed  Tests in the radiology section of CPT??: ordered and reviewed  Decide to obtain previous medical records or to obtain history from someone other than the patient: yes   Review and summarize past medical records: yes  Independent visualization of images, tracings, or specimens: yes    Risk of Complications, Morbidity, and/or Mortality  Presenting problems: high  Diagnostic procedures: high  Management options: high    Patient Progress  Patient progress: stable         Procedures    ED M.D. EKG interpretation: Normal sinus rhythm at 99 beats per minute. Normal axis. Normal intervals. No ectopy and no acute ischemic change noted. Lovey Newcomer, MD      PROGRESS NOTE:  8:55 AM  Will order more narcan. Patient responded to Narcan per EMS. He had some response to the same here. He was arousable and was protecting his airway appropriately. VS remained stable.      CONSULT NOTE:  10:03 AM Lovey Newcomer, MD communicated with Dr. Penne Lash, Consult for Hospitalist via [employer] Text.  Discussed available diagnostic tests and clinical findings. Dr. Glenetta Hew will come to the ED to admit the patient.     PROGRESS NOTE:  10:32 AM  Pt's x-rays appear to be normal. Pt has some snoring respirations but is rousable.

## 2017-10-10 NOTE — Other (Signed)
TRANSFER - OUT REPORT:    Verbal report given to Weyerhaeuser CompanyBarb RN (name) on William [name]  being transferred to CVICU (unit) for routine progression of care       Report consisted of patient???s Situation, Background, Assessment and   Recommendations(SBAR).     Information from the following report(s) SBAR, ED Summary, Intake/Output, MAR and Recent Results was reviewed with the receiving nurse.    Lines:   Peripheral IV 10/10/17 Right Antecubital (Active)        Opportunity for questions and clarification was provided.      Patient transported with:  Izola PriceMyers RN on cardiac monitor

## 2017-10-10 NOTE — Progress Notes (Signed)
TRANSFER - IN REPORT:    Verbal report received from Inez Catalinaara RN (name) on Waylan RocherJohn Morini  being received from ER (unit) for routine progression of care      Report consisted of patient???s Situation, Background, Assessment and   Recommendations(SBAR).     Information from the following report(s) SBAR, Intake/Output, MAR and Cardiac Rhythm NSR was reviewed with the receiving nurse.    Opportunity for questions and clarification was provided.      Assessment completed upon patient???s arrival to unit and care assumed.         1240: Pt  Arrived from ED per stretcher very sleepy but arousable to stimulation and name. Pt answers to his name and goes back to sleep.  Unable  to complete SAD scale to determine suicide risk. Pt does not stay awake long enough.

## 2017-10-10 NOTE — H&P (Signed)
[place] [facility]  HISTORY AND PHYSICAL    Name:  William, [name]  MR#:  [id number]  DOB:  07/25/1985  ACCOUNT #:  [id number]  ADMIT DATE:  10/10/2017    TIME OF DICTATION:  11:17 a.m.    PRIMARY CARE PHYSICIAN:  Unknown.    CHIEF COMPLAINT:  Acute drug overdose.    HISTORY OF PRESENTING ILLNESS:  Please note all history was obtained from the EMS records and the emergency room physician with records, as the patient is currently unresponsive and family members are unavailable.  A 32 year old gentleman with a reported history of seizure disorder with last breakthrough seizure about one and a half months prior to this emergency room presentation, was brought to the emergency room by EMS from the [facility] in [place] for acute drug overdose. Per the documentation, the patient's roommate found the patient lying on the bathroom floor, and at that time administered 4 mg of Narcan. On EMS's arrival, the patient was given another 4 mg of Narcan. The patient awoke and appeared more responsive, even answering questions stating that he snorted heroin prior to episode of unresponsiveness.  Per the documentation, the patient took his last dose of Keppra the day prior to presentation.  Further the patient had complaints of neck pain and low back pain.  There was no other documentation of headaches, visual deficits, chest pains, palpitation, shortness of breath, cough, fever, chills, abdominal pain, bladder or bowel irregularities.    PAST MEDICAL HISTORY:  Seizure disorder.    PAST SURGICAL HISTORY:  Unknown.    HOME MEDICATIONS:  Keppra, for which dose is unknown.    ALLERGIES:  NO KNOWN DRUG ALLERGIES.    SOCIAL HISTORY:  Significant for heroin use.  No other social history obtained.    FAMILY HISTORY:  Unobtainable.    REVIEW OF SYSTEMS:  Unobtainable due to patient's unresponsive state.    PHYSICAL EXAMINATION:  GENERAL:  The patient with deep sonorous breathing.   VITAL SIGNS:  Blood pressure 139/92, heart rate 101, respiratory rate 16, temperature 97.7 degrees Fahrenheit, saturating 96% on 2 liters nasal cannula.  HEENT:  Head exam, normocephalic.  Pupils are equal and reactive to light.  ENT exam:  Ear, nose, throat clear.  NECK:  No jugular venous distention or carotid bruits.  CARDIOVASCULAR:  S1 and S2, tachycardia.  No murmurs appreciated.  RESPIRATORY:  Bilateral air entry.  No adventitious sounds present.  GI:  Bowel sounds present.  The abdomen is soft, nontender.  No rebound.  No guarding.  GENITOURINARY:  Nil of note.  MUSCULOSKELETAL:  No asymmetry of the extremities.  No significant pedal edema.  CNS:  The patient was minimally responsive to deep stimuli.  SKIN:  Left-sided head hematoma.    LABORATORY DATA/RADIOGRAPHIC FINDINGS:  CT of the head without contrast, which did not show any acute infarcts, hemorrhages, tumors, or midline shift.  X-ray of the cervical spine without any evidence of fractures.  X-ray of the lumbar spine without any evidence of fractures or dislocations.  X-ray of the thoracic spine with no fractures or subluxations.    Arterial blood gas with a pH of 7.34, pCO2 of 47.2, pO2 of 125, bicarbonate of 25.9, saturation of 99% and that was done on 2 liters nasal cannula.  Urinalysis with yellow clear urine, negative nitrites, negative leukocytes.  Metabolic panel with sodium 138, potassium 3.6, chloride 104, bicarbonate 30, BUN 17, creatinine 1.28, glucose 154, calcium 8.7.  Normal liver function test.  CBC with a WBC of 10.7, hemoglobin 14.7, hematocrit 45.4 with a platelet count of 211.  Toxicology screen with alcohol level less than 10.  Other urine drug screen pending.    A 12-lead EKG personally reviewed that showed normal sinus rhythm at 99 per minute.    ASSESSMENT AND PLAN:  1.  Central nervous system:  The patient will be admitted to the inpatient level intensive care unit for management of acute toxic metabolic  encephalopathy due to acute drug overdose.  Seizure disorder, with daily Keppra therapy.  We will need to maintain a low threshold for any breakthrough seizures.  CT of the head without contrast, negative for any acute findings.  We will continue close neurologic checks and obtain neurology consult and further recommendations.  2   Resp: Increased Aspiration risk. Oxygen, Pulmonary toilet. CXR check.  3.  Renal:  Mild dehydration.  Intravenous fluids.  4.  Prophylaxis for gastrointestinal and deep venous thrombosis.  5.  Social:  Substance use disorder. For cessation counseling.  6.  Directives:  Full code with an extremely guarded prognosis.    In summary, a 32 year old gentleman with a past medical history significant for seizure disorder with a reported breakthrough seizure about one and a half months prior to this presentation, is being admitted to the inpatient level intensive care unit for management of acute toxic encephalopathy due to drug overdose. The patient is at increased risk for further decompensation and will benefit from close inpatient management with close neurologic checks, evaluation with complete toxicology screen, intensivist and neurology consults.    The patient's functional status prior to admission is significant for the patient being able to ambulate unassisted.    Total critical care time for admission 65 minutes.        Elta GuadeloupeSALLY-ANN T Ellington Cornia, MD      [id number]  D:  10/10/2017 11:28  T:  10/10/2017 13:59  JOB #:  [id number]

## 2017-10-10 NOTE — Consults (Signed)
Consult dictated. Reported hx of seizures. Suspect he had a seizure possibly related to heroin use and ?benzo withdrawal. Continue Keppra. Check EEG. Supoprtive care.  William [name] William Lexee Brashears, MD

## 2017-10-10 NOTE — Progress Notes (Signed)
Bedside and Verbal shift change report given to L Roderiguez RN (oncoming nurse) by Flo ShanksB Smith RN  (offgoing nurse). Report included the following information SBAR and Cardiac Rhythm Afib with some burst of NSR When coughing.

## 2017-10-10 NOTE — Consults (Signed)
[facility]  Pulmonary, Critical Care, and Sleep Medicine  Name: William [name] MRN: [id number]   DOB: [birth year] [facility]   Date: 10/10/2017          IMPRESSION:   1. Encephalopathy- ? Post ictal/ Rx/ post-traumatic  2. S/p heroin OD  3. Known BNZ use  4. SZ disorder by history  5. Hypoventilation      RECOMMENDATIONS:   ?? ICU observation  ?? Elevate HOB  ?? Monitor for airway compromise--> intubation if needed  ?? AED bolus'ed per ED MD  ?? Neurochecks- repeat imaging  ?? Neuro eval  ?? check CXR- ? Possible aspiration  ?? ICU protocols       Radiology  ( personally reviewed) Head CT nml  Spine series nml   ABG Recent Labs     10/10/17  1044   PHI 7.346*   PO2I 125*   PCO2I 47.2*          Subjective/Interval History:       Limited history  D/w ED attending  Pt w Sz d[facility]o  [facility]   [place]  Fall w head lac  variable MS changes    Apparent uses heroin and xanax  Given narcan +/- response x 2  Now arouses 1-2 words answers  Denies pain  Oriented x 3 - 2019/ hospital/[place]/self  Given Keppra by ED    Head CT/spine series neg  UDS noted    Patient Active Problem List   Diagnosis Code   ??? Drug overdose T50.901A     History reviewed. No pertinent past medical history.  History reviewed. No pertinent surgical history.  None     No Known Allergies     Social History     Socioeconomic History   ??? Marital status: UNKNOWN     Spouse name: Not on file   ??? Number of children: Not on file   ??? Years of education: Not on file   ??? Highest education level: Not on file   Occupational History   ??? Not on file   Social Needs   ??? Financial resource strain: Not on file   ??? Food insecurity:     Worry: Not on file     Inability: Not on file   ??? Transportation needs:     Medical: Not on file     Non-medical: Not on file   Tobacco Use   ??? Smoking status: Current Every Day Smoker     Packs/day: 0.50   Substance and Sexual Activity   ??? Alcohol use: Not on file    ??? Drug use: Yes     Types: Heroin   ??? Sexual activity: Not on file   Lifestyle   ??? Physical activity:     Days per week: Not on file     Minutes per session: Not on file   ??? Stress: Not on file   Relationships   ??? Social connections:     Talks on phone: Not on file     Gets together: Not on file     Attends religious service: Not on file     Active member of club or organization: Not on file     Attends meetings of clubs or organizations: Not on file     Relationship status: Not on file   ??? Intimate partner violence:     Fear of current or ex partner: Not on file  Emotionally abused: Not on file     Physically abused: Not on file     Forced sexual activity: Not on file   Other Topics Concern   ??? Not on file   Social History Narrative   ??? Not on file     History reviewed. No pertinent family history.    ROS unobtainable      Objective:      Vital Signs:    Visit Vitals  BP 125/66 (BP 1 Location: Left arm, BP Patient Position: At rest)   Pulse 90   Temp 98.5 ??F (36.9 ??C)   Resp 12   SpO2 97%           Intake/Output:   Last shift:         Last 3 shifts: 04/06 0701 - 04/06 1900  In: 2000 [I.V.:2000]  Out: 1200 [Urine:1200][id number]    Intake/Output Summary (Last 24 hours) at 10/10/2017 1225  Last data filed at 10/10/2017 1033  Gross per 24 hour   Intake 2000 ml   Output 1200 ml   Net 800 ml     EXAM:     Age-appropriate male  Snoring  NC oxygen arouses to voice  NC  Bruise L frontal area  Neck supple  pupils reactive  Moist MM  chest clear  CV S1S2   abd benign  LE no edea  Skin no rash  Non focal  Muscular nml        Data   I have personally reviewed data, flowsheets for the last 24 hours.        Labs:  Recent Labs     10/10/17  0742   WBC 10.7   HGB 14.7   HCT 45.4   PLT 211     Recent Labs     10/10/17  0742   NA 138   K 3.6   CL 104   CO2 30   GLU 154*   BUN 17   CREA 1.28   CA 8.7   ALB 3.8   SGOT 52*   ALT 55       Clarita Crane, MD  [facility]

## 2017-10-10 NOTE — Procedures (Signed)
[place] [facility]  EEG    Name:  William RocherMULLIS, William  MR#:  [id number]  DOB:  [birth year]  ACCOUNT #:  [id number]  DATE OF SERVICE:  10/11/2017      REQUESTING PROVIDER:  Lance BoschAmandeep S Evalette Montrose, MD    HISTORY:  The patient is a 32 year old male who is being evaluated after recent seizure-like activity.    DESCRIPTION:  This is an 18-channel EEG performed on an awake and drowsy patient.  During wakefulness, the dominant posterior background rhythm consists of symmetric, well modulated, medium voltage rhythms in the 8 to  9 Hz frequency range out of the posterior head region.  Drowsiness is characterized by slowing in vertex waves in the central area.  Intermittently, mild theta slowing was seen in both frontal temporal head regions.  Photic stimulation elicits symmetric driving response.  Hyperventilation was not performed.    EEG SUMMARY:  Mildly abnormal EEG due to mild intermittent slowing seen in both frontal temporal head regions, right slightly more than left.    CLINICAL INTERPRETATION:  This EEG shows mild focal slowing in the both frontal temporal areas which is a nonspecific finding.  No convincingly epileptiform features were noted.  No seizure was recorded.      Lance BoschAMANDEEP S Kourtlyn Charlet, MD      AS/[id number]/  D:  10/12/2017 12:57  T:  10/12/2017 13:02  JOB #:  [id number]

## 2017-10-10 NOTE — Consults (Signed)
[facility] [facility]  CONSULTATION    Name:  William RocherMULLIS, William  MR#:  [id number]  DOB:  Mar 28, 1986  ACCOUNT #:  [id number]  DATE OF SERVICE:  10/10/2017    REQUESTING PHYSICIAN:  Dr. Glenetta HewMcIntosh    REASON FOR EVALUATION:  Altered mental status.    HISTORY OF PRESENT ILLNESS:  The patient is a 32 year old male with no clear known past medical history, who is a travel Corporate treasurerbasketball referee.  He was here in town with his friend and they were staying in a hotel.  His friend apparently found him on the bathroom floor, unresponsive.  He reportedly snorted heroin this morning.  Friend gave him 4 mg of Narcan and EMS were called.  When they arrived, they gave him more Narcan.  He became somewhat responsive and was answering questions upon arrival in the Emergency Department, but has remained lethargic for the most part.  There was no witnessed seizure-like event, but there is reported history of seizures in the past.  He takes Keppra and last dose of Keppra was yesterday morning.  There was seizure about 2 months ago and there was a report of him overdosing himself on drugs back then as well.  Currently, he is quite sleepy and is unable to obtain a detailed review of systems.    PAST MEDICAL HISTORY:  As mentioned above.    ALLERGIES:  NONE.    SOCIAL HISTORY:  The [place] in [place] and there is drug use involved.  No known history of alcohol or smoking.    FAMILY HISTORY:  Noncontributory.    REVIEW OF SYSTEMS:  Difficult to obtain.    PHYSICAL EXAMINATION:  GENERAL:  The patient is lying in bed, in no acute distress.  VITAL SIGNS:  Blood pressure 106/51, temperature 98.6, pulse is 79.  HEENT:  Pupils are equal, reactive.  He opens his eyes to command, but drifts back to sleep.  Tries to squeeze hands, but not fully.  Was moving his extremities earlier today per staff.  Strength testing was difficult.  Deep tendon reflexes hypoactive.  Toes downgoing.  Sensory, cerebellar, and gait exam was deferred.  HEART:   Regular rate and rhythm.  CHEST:  Clear.  ABDOMEN:  Soft, nontender.  Positive bowel sounds.  EXTREMITIES:  No edema.    LABORATORY DATA:  CBC and chemistry is unremarkable.  CT scan of the brain did not show any acute abnormalities.    ASSESSMENT AND PLAN:  A 32 year old male with reported history of seizures, who is admitted with altered mental status.  I suspect that he had a seizure possibly related to heroin use and question benzodiazepine withdrawal/noncompliance with the anticonvulsant.  Continue Keppra 500 mg twice daily.  We will check an EEG.  Continue with supportive care and I will reassess once he is more awake.    Thank you for this consultation.        Lance BoschAMANDEEP S Laporchia Nakajima, MD      [id number]  D:  10/10/2017 13:42  T:  10/10/2017 15:04  JOB #:  [id number]

## 2017-10-10 NOTE — Consults (Signed)
Consult dictated. Reported hx of seizures. Suspect he had a seizure possibly related to heroin use and ?benzo withdrawal. Continue Keppra. Check EEG. Supoprtive care.  Lance BoschAmandeep S Vlad Mayberry, MD

## 2017-10-10 NOTE — Consults (Signed)
[facility]  Pulmonary, Critical Care, and Sleep Medicine  Name: William [name] MRN: [id number]   DOB: [birth year] [facility]   Date: 10/10/2017          IMPRESSION:   1. Encephalopathy- ? Post ictal/ Rx/ post-traumatic  2. S/p heroin OD  3. Known BNZ use  4. SZ disorder by history  5. Hypoventilation      RECOMMENDATIONS:   ?? ICU observation  ?? Elevate HOB  ?? Monitor for airway compromise--> intubation if needed  ?? AED bolus'ed per ED MD  ?? Neurochecks- repeat imaging  ?? Neuro eval  ?? check CXR- ? Possible aspiration  ?? ICU protocols       Radiology  ( personally reviewed) Head CT nml  Spine series nml   ABG Recent Labs     10/10/17  1044   PHI 7.346*   PO2I 125*   PCO2I 47.2*          Subjective/Interval History:       Limited history  D/w ED attending  Pt w Sz d/o  [facility]   [place]  Fall w head lac  variable MS changes    Apparent uses heroin and xanax  Given narcan +/- response x 2  Now arouses 1-2 words answers  Denies pain  Oriented x 3 - 2019/ hospital/[facility]/self  Given Keppra by ED    Head CT/spine series neg  UDS noted    Patient Active Problem List   Diagnosis Code   ??? Drug overdose T50.901A     History reviewed. No pertinent past medical history.  History reviewed. No pertinent surgical history.  None     No Known Allergies     Social History     Socioeconomic History   ??? Marital status: UNKNOWN     Spouse name: Not on file   ??? Number of children: Not on file   ??? Years of education: Not on file   ??? Highest education level: Not on file   Occupational History   ??? Not on file   Social Needs   ??? Financial resource strain: Not on file   ??? Food insecurity:     Worry: Not on file     Inability: Not on file   ??? Transportation needs:     Medical: Not on file     Non-medical: Not on file   Tobacco Use   ??? Smoking status: Current Every Day Smoker     Packs/day: 0.50   Substance and Sexual Activity   ??? Alcohol use: Not on file   ??? Drug  use: Yes     Types: Heroin   ??? Sexual activity: Not on file   Lifestyle   ??? Physical activity:     Days per week: Not on file     Minutes per session: Not on file   ??? Stress: Not on file   Relationships   ??? Social connections:     Talks on phone: Not on file     Gets together: Not on file     Attends religious service: Not on file     Active member of club or organization: Not on file     Attends meetings of clubs or organizations: Not on file     Relationship status: Not on file   ??? Intimate partner violence:     Fear of current or ex partner: Not on file  Emotionally abused: Not on file     Physically abused: Not on file     Forced sexual activity: Not on file   Other Topics Concern   ??? Not on file   Social History Narrative   ??? Not on file     History reviewed. No pertinent family history.    ROS unobtainable        Objective:       Vital Signs:    Visit Vitals  BP 125/66 (BP 1 Location: Left arm, BP Patient Position: At rest)   Pulse 90   Temp 98.5 ??F (36.9 ??C)   Resp 12   SpO2 97%           Intake/Output:   Last shift:         Last 3 shifts: 04/06 0701 - 04/06 1900  In: 2000 [I.V.:2000]  Out: 1200 [Urine:1200]RRIOLAST3    Intake/Output Summary (Last 24 hours) at 10/10/2017 1225  Last data filed at 10/10/2017 1033  Gross per 24 hour   Intake 2000 ml   Output 1200 ml   Net 800 ml     EXAM:     Age-appropriate male  Snoring  NC oxygen arouses to voice  NC  Bruise L frontal area  Neck supple  pupils reactive  Moist MM  chest clear  CV S1S2   abd benign  LE no edea  Skin no rash  Non focal  Muscular nml        Data    I have personally reviewed data, flowsheets for the last 24 hours.        Labs:  Recent Labs     10/10/17  0742   WBC 10.7   HGB 14.7   HCT 45.4   PLT 211     Recent Labs     10/10/17  0742   NA 138   K 3.6   CL 104   CO2 30   GLU 154*   BUN 17   CREA 1.28   CA 8.7   ALB 3.8   SGOT 52*   ALT 55       Clarita Crane, MD  [facility]

## 2017-10-10 NOTE — Discharge Summary (Signed)
[facility]  DISCHARGE SUMMARY    Name:  William [name], William [name]  MR#:  [id number]  DOB:  Mar 28, 1986  ACCOUNT #:  [id number]  ADMIT DATE:  10/10/2017  DISCHARGE DATE:      MAIN DIAGNOSES ON ADMISSION:  1.  Acute toxic encephalopathy due to acute drug overdose.  2.  Acute drug overdose.  3.  Dehydration.    CONSULTANTS:  1.  Intensivist.  2.  Neurologist.    HOSPITAL COURSE:  A 32 year old gentleman with a history of seizure disorder with last breakthrough seizure being about one and a half months prior to the emergency room presentation, was brought to the [facility] Emergency Room by EMS from the [facility] in [place], [place] for an acute drug overdose.  Per the documentation, the patient's roommate found the patient lying on the bathroom floor and at that time, immediately administered 4 mg of Narcan. On EMS's arrival to the hotel, the patient was given another 4 mg of Narcan. The patient awoke and appeared more responsive, even answering questions, stating that he was not had heroin prior to the episode of unresponsiveness. Per the documentation, the patient took his last dose of Keppra the day prior to presentation.  The patient also complained of some neck pain and low back pain.  There was no other documentation of complaints of headaches, visual deficits, chest pains, palpitations, shortness of breath, cough, fevers, chills, abdominal pain, bladder or bowel irregularities.  The patient was initially admitted to the intensive care unit.  On further evaluation with a CT of the head without contrast which did not show any acute infarcts, hemorrhages, tumors or midline shift.  An x-ray of the cervical spine, that did not show any evidence of fractures.  An x-ray of the lumbar spine, that did not show any evidence of fractures or dislocations,  An x-ray of the thoracic spine with no fractures or subluxations.  An arterial blood gas showed a pH of 7.34, pCO2 of 47.2, a PaO2 of 125, a  bicarb of 25.9, saturation of 99% and that was done on 2 liters nasal cannula.  A urinalysis that showed clear yellow urine, negative nitrites, negative leukocytes.  A metabolic panel with a sodium of 138, potassium 3.6, chloride of 104, bicarb of 30, a BUN of 17, creatinine 1.28, a glucose of 154, calcium 8.7.  Normal liver function tests.  A CBC with a WBC of 10.7, hemoglobin 14.7, hematocrit 45.4 with a platelet count of 211.  The toxicology screen positive for opiates and benzodiazepines.  A 12-lead EKG which was personally reviewed and that showed normal sinus rhythm at 99 per minute.  Initial management included the patient being kept nothing by mouth status, IV fluids, IV antiemetics, and as needed Narcan.  A low threshold was maintained for aspiration in view of the acute drug overdose with the encephalopathy.  The patient was seen by the intensivist, who concurred with management with recommendations for review of x-rays and maintaining the low threshold for aspiration.  The patient was treated with as needed nebulizers and pulmonary toilet.  The patient was also seen by the neurologist, who concurred with management including IV Keppra until the patient was awake, alert and able to tolerate all therapies.  The patient eventually became more awake, more alert, was transferred to the neuro step-down unit for ongoing care, was able to tolerate and advancing diet without any evidence of aspiration or difficulty in swallowing.  The patient underwent EEG testing  which did not show any evidence of active seizures.  The patient had no breakthrough seizures throughout the hospital stay.  No other focal neurologic deficit.  The patient was counseled on the importance of illicit substance use cessation and possibility of enrolling in a drug rehab program on discharge.  With clinical and chemical stability and after being cleared by the neurology service, the patient was deemed stable for discharge to home this day,  10/12/2017.    EXAMINATION FINDINGS ON THIS DAY OF DISCHARGE:  GENERAL:  The patient awake, alert, denying new complaints.  VITAL SIGNS:  Blood pressure 133/76, heart rate 78, respiratory rate 15, afebrile, saturating 97% on room air.  HEAD EXAM:  Normocephalic.  EYES:  Pupils equal and reactive to light.  ENT EXAM:  Ears, nose, throat clear.  NECK EXAM:  No jugular venous distention or carotid bruits.  CARDIOVASCULAR EXAM:  S1, S2 present.  RESPIRATORY EXAM:  Lungs with decreased air entry at both bases without any crepitations or rhonchi.  GI EXAM:  Bowel sounds present.  The abdomen is soft, nontender.  No rebound.  No guarding.  GENITOURINARY EXAM:  Nil of note.  MUSCULOSKELETAL EXAM:  No asymmetry of the extremities.  CNS EXAM:  The patient was awake, alert, oriented to time, date, place, person.    LABORATORY FINDINGS ON DISCHARGE:  A CBC with a WBC of 10.3, hemoglobin 14.1, hematocrit 43.8, a platelet count of 189.  Metabolic panel with a sodium of 140, potassium 3.8, a chloride of 109, a bicarb of 26, a BUN of 13, a creatinine of 0.85, a glucose of 95, calcium 8.9.    FINAL DIAGNOSES ON DISCHARGE:  1.  Resolved acute toxic encephalopathy due to acute drug overdose present on admission.  2.  Acute drug overdose present on admission.  3.  Resolved and probable basilar atelectasis without any pneumonia or respiratory failure.  4.  Resolved probable dehydration.  5.  Substance use Disorder.  Cessation counseling done.    DISCHARGE MEDICATIONS:  Keppra 750 mg p.o. twice a day.    DISCHARGE INSTRUCTIONS:  The patient is to follow a regular diet as tolerated.  The patient is to achieve at least 30 minutes of moderate walking most days of the week.  The patient is to continue taking the Keppra as prescribed.  The patient instructed to not drive or operate any heavy machinery or equipment with the diagnosis of seizure disorder.  The patient encouraged to enroll in a drug rehab program.  The patient is to follow up  with PCP in 1 week's time from discharge.  The patient is to follow up with neurologist in 2 to 3 weeks' time from discharge.  The patient is stable for discharge to home this day, 10/12/2017.  The entire discharge plans including all medications, their side effects, the importance of adherence to all outpatient followup plans discussed in detail with the patient.  All questions and concerns were addressed.    Total time for this discharge summary 45 minutes        Elta GuadeloupeSALLY-ANN T Brennen Camper, MD      [id number]  D:  10/12/2017 13:22  T:  10/12/2017 13:27  JOB #:  [id number]

## 2017-10-11 LAB — METABOLIC PANEL, COMPREHENSIVE
A-G Ratio: 1.1 (ref 1.1–2.2)
ALT (SGPT): 38 U/L (ref 12–78)
AST (SGOT): 22 U/L (ref 15–37)
Albumin: 3.3 g/dL — ABNORMAL LOW (ref 3.5–5.0)
Alk. phosphatase: 77 U/L (ref 45–117)
Anion gap: 5 mmol/L (ref 5–15)
BUN/Creatinine ratio: 13 (ref 12–20)
BUN: 12 MG/DL (ref 6–20)
Bilirubin, total: 0.6 MG/DL (ref 0.2–1.0)
CO2: 27 mmol/L (ref 21–32)
Calcium: 8.7 MG/DL (ref 8.5–10.1)
Chloride: 110 mmol/L — ABNORMAL HIGH (ref 97–108)
Creatinine: 0.95 MG/DL (ref 0.70–1.30)
GFR est AA: >60 mL/min/{1.73_m2} (ref >60)
GFR est non-AA: >60 mL/min/{1.73_m2} (ref >60)
Globulin: 3 g/dL (ref 2.0–4.0)
Glucose: 99 mg/dL (ref 65–100)
Potassium: 3.9 mmol/L (ref 3.5–5.1)
Protein, total: 6.3 g/dL — ABNORMAL LOW (ref 6.4–8.2)
Sodium: 142 mmol/L (ref 136–145)

## 2017-10-11 LAB — CBC W/O DIFF
ABSOLUTE NRBC: 0 10*3/uL (ref 0.00–0.01)
HCT: 44 % (ref 36.6–50.3)
HGB: 14.3 g/dL (ref 12.1–17.0)
MCH: 29.5 PG (ref 26.0–34.0)
MCHC: 32.5 g/dL (ref 30.0–36.5)
MCV: 90.9 FL (ref 80.0–99.0)
MPV: 10.6 FL (ref 8.9–12.9)
NRBC: 0 PER 100 WBC
PLATELET: 199 10*3/uL (ref 150–400)
RBC: 4.84 M/uL (ref 4.10–5.70)
RDW: 15 % — ABNORMAL HIGH (ref 11.5–14.5)
WBC: 13.8 10*3/uL — ABNORMAL HIGH (ref 4.1–11.1)

## 2017-10-11 LAB — LEVETIRACETAM (KEPPRA): Levetiracetam (Keppra): 1.8 ug/mL — ABNORMAL LOW (ref 10.0–40.0)

## 2017-10-11 MED ORDER — NALOXONE 0.4 MG/ML INJECTION
0.4 mg/mL | INTRAMUSCULAR | Status: DC | PRN
Start: 2017-10-11 — End: 2017-10-12

## 2017-10-11 MED FILL — LEVETIRACETAM 500 MG/5 ML IV SOLN: 500 mg/5 mL | INTRAVENOUS | Qty: 5

## 2017-10-11 MED FILL — NORMAL SALINE FLUSH 0.9 % INJECTION SYRINGE: INTRAMUSCULAR | Qty: 10

## 2017-10-11 MED FILL — ACETAMINOPHEN 325 MG TABLET: 325 mg | ORAL | Qty: 2

## 2017-10-11 NOTE — Progress Notes (Cosign Needed)
Problem: Breathing Pattern - Ineffective  Goal: *Absence of hypoxia  Outcome: Progressing Towards Goal  Goal: *Use of effective breathing techniques  Outcome: Progressing Towards Goal     Problem: Patient Education: Go to Patient Education Activity  Goal: Patient/Family Education  Outcome: Progressing Towards Goal     Problem: Falls - Risk of  Goal: *Absence of Falls  Description  Document Bridgette HabermannSchmid Fall Risk and appropriate interventions in the flowsheet.  Outcome: Progressing Towards Goal     Problem: Pressure Injury - Risk of  Goal: *Prevention of pressure injury  Description  Document Braden Scale and appropriate interventions in the flowsheet.  Outcome: Progressing Towards Goal

## 2017-10-11 NOTE — Progress Notes (Signed)
Hospitalist Progress Note                               Elta GuadeloupeSally-Ann T Talya Quain, MD                                     Answering service: [phone]                               OR [phone] from in house phone                                     Date of Service:  10/11/2017  NAME:  William RocherJohn [name]  DOB:  1985-08-05  MRN:  [id number]      Admission Summary:   32 year old gentleman with a reported history of seizure disorder with last breakthrough seizure about one and a half months prior to the emergency room presentation, was brought to the emergency room by EMS [facility] in [place] for acute drug overdose. Per the documentation, the patient's roommate found the patient lying on the bathroom floor, and at that time administered 4 mg of Narcan. On EMS's arrival, the patient was given another 4 mg of Narcan. The patient awoke and appeared more responsive, even answering questions stating that he snorted heroin prior to episode of unresponsiveness. Per the documentation, the patient took his last dose of Keppra the day prior to presentation. Further the patient had complaints of neck pain and low back pain.  There was no other documentation of headaches, visual deficits, chest pains, palpitation, shortness of breath, cough, fever, chills, abdominal pain, bladder or bowel irregularities.    Reason for follow up:       CC: Drug overdose    Patient was awake and alert on am rounds. Denied any complaints.     Assessment & Plan:     1) CNS: Resolving acute Toxic encephalopathy due to drug overdose. Seizure Disorder with H/O recent breakthrough seizure. CT head negative for acute findings. EEG pending. Continue Keppra and Neurochecks. Neurology eval noted and appreciated.    2) Resp: Probable basilar atelectasis without any definite pneumonia. As needed oxygen, nebulizers, incentive spirometry.    3. Renal: Resolved probable dehydration.      4. Social: Substance Use Disorder. Cessation counselling done.    5. Prophylaxis: DVT.    6. Directives: Full Code.    7. [facility]. D/W patient and RN.    Hospital Problems  Never Reviewed          Codes Class Noted POA    Drug overdose ICD-10-CM: T50.901A  ICD-9-CM: 977.9, E980.5  10/10/2017 Unknown              Physical Examination:      Last 24hrs VS reviewed since prior progress note. Most recent are:  Visit Vitals  BP 120/65 (BP 1 Location: Left arm, BP Patient Position: At rest)   Pulse 81   Temp 98.3 ??F (36.8 ??C)   Resp 14   Ht 6' (1.829 m)   Wt 80.5 kg (177 lb 8 oz)   SpO2 96%   BMI 24.07 kg/m??           Constitutional:  No acute distress, cooperative, pleasant??   HEENT: Head is a traumatic,  Un icteric sclera.Pink conjunctiva,no erythema or discharge.Oral mucous moist, oropharynx benign. Neck supple,    Resp:  Decreased air entry Bibasilarly.   CV:  Regular rhythm, normal rate, no murmurs, gallops, rubs    GI:  Soft, non distended, non tender. normoactive bowel sounds, no hepatosplenomegaly    GU:  No CVA or suprapubic tenderness   Skin  :  No erythema,rash,bullae,dipigmentation     Musculoskeletal:  No edema, warm, 2+ pulses throughout    Neurologic: Awake, alert and oriented         Intake/Output Summary (Last 24 hours) at 10/11/2017 1300  Last data filed at 10/11/2017 1249  Gross per 24 hour   Intake 3924.16 ml   Output 3525 ml   Net 399.16 ml            Labs:     Recent Labs     10/11/17  0330 10/10/17  0742   WBC 13.8* 10.7   HGB 14.3 14.7   HCT 44.0 45.4   PLT 199 211     Recent Labs     10/11/17  0330 10/10/17  0742   NA 142 138   K 3.9 3.6   CL 110* 104   CO2 27 30   BUN 12 17   CREA 0.95 1.28   GLU 99 154*   CA 8.7 8.7   MG  --  2.3     Recent Labs     10/11/17  0330 10/10/17  0742   SGOT 22 52*   ALT 38 55   AP 77 75   TBILI 0.6 0.2   TP 6.3* 7.1   ALB 3.3* 3.8   GLOB 3.0 3.3     No results for input(s): INR, PTP, APTT in the last 72 hours.     No lab exists for component: INREXT   No results for input(s): FE, TIBC, PSAT, FERR in the last 72 hours.   No results found for: FOL, RBCF   No results for input(s): PH, PCO2, PO2 in the last 72 hours.  No results for input(s): CPK, CKNDX, TROIQ in the last 72 hours.    No lab exists for component: CPKMB  No results found for: CHOL, CHOLX, CHLST, CHOLV, HDL, LDL, LDLC, DLDLP, TGLX, TRIGL, TRIGP, CHHD, CHHDX  No results found for: GLUCPOC  Lab Results   Component Value Date/Time    Color YELLOW/STRAW 10/10/2017 10:30 AM    Appearance CLEAR 10/10/2017 10:30 AM    Specific gravity 1.010 10/10/2017 10:30 AM    pH (UA) 6.0 10/10/2017 10:30 AM    Protein NEGATIVE  10/10/2017 10:30 AM    Glucose NEGATIVE  10/10/2017 10:30 AM    Ketone NEGATIVE  10/10/2017 10:30 AM    Bilirubin NEGATIVE  10/10/2017 10:30 AM    Urobilinogen 0.2 10/10/2017 10:30 AM    Nitrites NEGATIVE  10/10/2017 10:30 AM    Leukocyte Esterase NEGATIVE  10/10/2017 10:30 AM         Medications Reviewed:     Current Facility-Administered Medications   Medication Dose Route Frequency   ??? sodium chloride (NS) flush 5-40 mL  5-40 mL IntraVENous Q8H   ??? sodium chloride (NS) flush 5-40 mL  5-40 mL IntraVENous PRN   ??? acetaminophen (TYLENOL) tablet 650 mg  650 mg Oral Q4H PRN   ??? ondansetron (ZOFRAN) injection 4 mg  4 mg IntraVENous Q6H PRN   ???  dextrose 5% and 0.9% NaCl infusion  125 mL/hr IntraVENous CONTINUOUS   ??? levETIRAcetam (KEPPRA) 500 mg in 0.9% sodium chloride 100 mL IVPB  500 mg IntraVENous Q12H   ??? naloxone (NARCAN) injection 0.4 mg  0.4 mg IntraVENous EVERY 2 MINUTES AS NEEDED     ______________________________________________________________________  EXPECTED LENGTH OF STAY: - - -  ACTUAL LENGTH OF STAY:          1                 Elta Guadeloupe, MD

## 2017-10-11 NOTE — Other (Signed)
TRANSFER - IN REPORT:    Verbal report received from Lisa(name) on William [name]  being received from CCu(unit) for routine progression of care      Report consisted of patient???s Situation, Background, Assessment and   Recommendations(SBAR).     Information from the following report(s) SBAR, ED Summary, Intake/Output, Recent Results and Cardiac Rhythm NSR was reviewed with the receiving nurse.    Opportunity for questions and clarification was provided.      Assessment completed upon patient???s arrival to unit and care assumed.

## 2017-10-11 NOTE — Progress Notes (Signed)
TRANSFER - OUT REPORT:    Verbal report given to Stacey,RN(name) on William RocherJohn [name]  being transferred to N[facility]U 663(unit) for routine progression of care       Report consisted of patient???s Situation, Background, Assessment and   Recommendations(SBAR).     Information from the following report(s) SBAR, Kardex, [facility] and Recent Results was reviewed with the receiving nurse.    Lines:   Peripheral IV 10/10/17 Right Antecubital (Active)   Site Assessment Clean, dry, & intact 10/11/2017  8:00 AM   Phlebitis Assessment 0 10/11/2017  8:00 AM   Infiltration Assessment 0 10/11/2017  8:00 AM   Dressing Status Clean, dry, & intact 10/11/2017  8:00 AM   Dressing Type Tape;Transparent 10/11/2017  8:00 AM   Hub Color/Line Status Green;Infusing 10/11/2017  8:00 AM   Action Taken Open ports on tubing capped 10/11/2017  8:00 AM   Alcohol Cap Used Yes 10/11/2017  8:00 AM        Opportunity for questions and clarification was provided.      Patient transported with:   Monitor  O2 @ 2 liters

## 2017-10-11 NOTE — Other (Signed)
RN assigned patient in chart reviewing for admission to [facility]

## 2017-10-11 NOTE — Progress Notes (Signed)
EEG completed.

## 2017-10-11 NOTE — Progress Notes (Signed)
Pulmonary /CCM    Events and chart reviewed  Nothing to add.    Available as needed

## 2017-10-11 NOTE — Other (Signed)
Bedside shift change report given to Thomas (oncoming nurse) by Carter (offgoing nurse). Report included the following information SBAR, ED Summary, MAR, Recent Results and Cardiac Rhythm NSR.

## 2017-10-12 LAB — CBC W/O DIFF
ABSOLUTE NRBC: 0 10*3/uL (ref 0.00–0.01)
HCT: 43.8 % (ref 36.6–50.3)
HGB: 14.1 g/dL (ref 12.1–17.0)
MCH: 29.1 PG (ref 26.0–34.0)
MCHC: 32.2 g/dL (ref 30.0–36.5)
MCV: 90.5 FL (ref 80.0–99.0)
MPV: 10.9 FL (ref 8.9–12.9)
NRBC: 0 PER 100 WBC
PLATELET: 189 10*3/uL (ref 150–400)
RBC: 4.84 M/uL (ref 4.10–5.70)
RDW: 14.6 % — ABNORMAL HIGH (ref 11.5–14.5)
WBC: 10.3 10*3/uL (ref 4.1–11.1)

## 2017-10-12 LAB — METABOLIC PANEL, BASIC
Anion gap: 5 mmol/L (ref 5–15)
BUN/Creatinine ratio: 15 (ref 12–20)
BUN: 13 MG/DL (ref 6–20)
CO2: 26 mmol/L (ref 21–32)
Calcium: 8.9 MG/DL (ref 8.5–10.1)
Chloride: 109 mmol/L — ABNORMAL HIGH (ref 97–108)
Creatinine: 0.85 MG/DL (ref 0.70–1.30)
GFR est AA: >60 mL/min/{1.73_m2} (ref >60)
GFR est non-AA: >60 mL/min/{1.73_m2} (ref >60)
Glucose: 95 mg/dL (ref 65–100)
Potassium: 3.8 mmol/L (ref 3.5–5.1)
Sodium: 140 mmol/L (ref 136–145)

## 2017-10-12 MED ORDER — ALPRAZOLAM 0.5 MG TAB
0.5 mg | Freq: Once | ORAL | Status: AC
Start: 2017-10-12 — End: 2017-10-12
  Administered 2017-10-12: 18:00:00 via ORAL

## 2017-10-12 MED FILL — LEVETIRACETAM 500 MG/5 ML IV SOLN: 500 mg/5 mL | INTRAVENOUS | Qty: 5

## 2017-10-12 MED FILL — NORMAL SALINE FLUSH 0.9 % INJECTION SYRINGE: INTRAMUSCULAR | Qty: 10

## 2017-10-12 MED FILL — ALPRAZOLAM 0.5 MG TAB: 0.5 mg | ORAL | Qty: 1

## 2017-10-12 MED FILL — ACETAMINOPHEN 325 MG TABLET: 325 mg | ORAL | Qty: 2

## 2017-10-12 NOTE — Progress Notes (Signed)
I have reviewed discharge instructions with the patient.  The patient verbalized understanding. Telemetry and PIV discontinued. Pt discharged with belongings via [employer] bus to home.

## 2017-10-12 NOTE — Discharge Summary (Signed)
[facility]  DIS[facility]    Name:  William [name], William [name]  MR#:  [id number]  DOB:  Mar 28, 1986  ACCOUNT #:  [id number]  ADMIT DATE:  10/10/2017  DISCHARGE DATE:      MAIN DIAGNOSES ON ADMISSION:  1.  Acute toxic encephalopathy due to acute drug overdose.  2.  Acute drug overdose.  3.  Dehydration.    CONSULTANTS:  1.  Intensivist.  2.  Neurologist.    HOSPITAL COURSE:  A 32 year old gentleman with a history of seizure disorder with last breakthrough seizure being about one and a half months prior to the emergency room presentation, was brought to the [facility] Emergency Room by EMS from the [facility] in [place], [place] for an acute drug overdose.  Per the documentation, the patient's roommate found the patient lying on the bathroom floor and at that time, immediately administered 4 mg of Narcan. On EMS's arrival to the hotel, the patient was given another 4 mg of Narcan. The patient awoke and appeared more responsive, even answering questions, stating that he was not had heroin prior to the episode of unresponsiveness. Per the documentation, the patient took his last dose of Keppra the day prior to presentation.  The patient also complained of some neck pain and low back pain.  There was no other documentation of complaints of headaches, visual deficits, chest pains, palpitations, shortness of breath, cough, fevers, chills, abdominal pain, bladder or bowel irregularities.  The patient was initially admitted to the intensive care unit.  On further evaluation with a CT of the head without contrast which did not show any acute infarcts, hemorrhages, tumors or midline shift.  An x-ray of the cervical spine, that did not show any evidence of fractures.  An x-ray of the lumbar spine, that did not show any evidence of fractures or dislocations,  An x-ray of the thoracic spine with no fractures or subluxations.  An arterial blood gas showed a pH of 7.34, pCO2 of 47.2, a PaO2 of 125, a  bicarb of 25.9, saturation of 99% and that was done on 2 liters nasal cannula.  A urinalysis that showed clear yellow urine, negative nitrites, negative leukocytes.  A metabolic panel with a sodium of 138, potassium 3.6, chloride of 104, bicarb of 30, a BUN of 17, creatinine 1.28, a glucose of 154, calcium 8.7.  Normal liver function tests.  A CBC with a WBC of 10.7, hemoglobin 14.7, hematocrit 45.4 with a platelet count of 211.  The toxicology screen positive for opiates and benzodiazepines.  A 12-lead EKG which was personally reviewed and that showed normal sinus rhythm at 99 per minute.  Initial management included the patient being kept nothing by mouth status, IV fluids, IV antiemetics, and as needed Narcan.  A low threshold was maintained for aspiration in view of the acute drug overdose with the encephalopathy.  The patient was seen by the intensivist, who concurred with management with recommendations for review of x-rays and maintaining the low threshold for aspiration.  The patient was treated with as needed nebulizers and pulmonary toilet.  The patient was also seen by the neurologist, who concurred with management including IV Keppra until the patient was awake, alert and able to tolerate all therapies.  The patient eventually became more awake, more alert, was transferred to the neuro step-down unit for ongoing care, was able to tolerate and advancing diet without any evidence of aspiration or difficulty in swallowing.  The patient underwent EEG testing  which did not show any evidence of active seizures.  The patient had no breakthrough seizures throughout the hospital stay.  No other focal neurologic deficit.  The patient was counseled on the importance of illicit substance use cessation and possibility of enrolling in a drug rehab program on discharge.  With clinical and chemical stability and after being cleared by the neurology service, the patient was deemed stable for discharge to  home this day, 10/12/2017.    EXAMINATION FINDINGS ON THIS DAY OF DISCHARGE:  GENERAL:  The patient awake, alert, denying new complaints.  VITAL SIGNS:  Blood pressure 133/76, heart rate 78, respiratory rate 15, afebrile, saturating 97% on room air.  HEAD EXAM:  Normocephalic.  EYES:  Pupils equal and reactive to light.  ENT EXAM:  Ears, nose, throat clear.  NECK EXAM:  No jugular venous distention or carotid bruits.  CARDIOVASCULAR EXAM:  S1, S2 present.  RESPIRATORY EXAM:  Lungs with decreased air entry at both bases without any crepitations or rhonchi.  GI EXAM:  Bowel sounds present.  The abdomen is soft, nontender.  No rebound.  No guarding.  GENITOURINARY EXAM:  Nil of note.  MUSCULOSKELETAL EXAM:  No asymmetry of the extremities.  CNS EXAM:  The patient was awake, alert, oriented to time, date, place, person.    LABORATORY FINDINGS ON DISCHARGE:  A CBC with a WBC of 10.3, hemoglobin 14.1, hematocrit 43.8, a platelet count of 189.  Metabolic panel with a sodium of 140, potassium 3.8, a chloride of 109, a bicarb of 26, a BUN of 13, a creatinine of 0.85, a glucose of 95, calcium 8.9.    FINAL DIAGNOSES ON DISCHARGE:  1.  Resolved acute toxic encephalopathy due to acute drug overdose present on admission.  2.  Acute drug overdose present on admission.  3.  Resolved and probable basilar atelectasis without any pneumonia or respiratory failure.  4.  Resolved probable dehydration.  5.  Substance use Disorder.  Cessation counseling done.    DISCHARGE MEDICATIONS:  Keppra 750 mg p.o. twice a day.    DISCHARGE INSTRUCTIONS:  The patient is to follow a regular diet as tolerated.  The patient is to achieve at least 30 minutes of moderate walking most days of the week.  The patient is to continue taking the Keppra as prescribed.  The patient instructed to not drive or operate any heavy machinery or equipment with the diagnosis of seizure disorder.  The  patient encouraged to enroll in a drug rehab program.  The patient is to follow up with PCP in 1 week's time from discharge.  The patient is to follow up with neurologist in 2 to 3 weeks' time from discharge.  The patient is stable for discharge to home this day, 10/12/2017.  The entire discharge plans including all medications, their side effects, the importance of adherence to all outpatient followup plans discussed in detail with the patient.  All questions and concerns were addressed.    Total time for this discharge summary 45 minutes        Elta GuadeloupeSALLY-ANN T Krystofer Hevener, MD      [id number]  D:  10/12/2017 13:22  T:  10/12/2017 13:27  JOB #:  [id number]

## 2017-10-12 NOTE — Progress Notes (Signed)
CM met with pt to discuss his need for transportation back to his home in [place]. Per pt, he has the means to purchase a bus ticket. CM could not find a route to [place],but located routes to [facility] and [place]. He would rather go to [facility] and then get a Lyft/[employer] to his home. He also stated that he will follow up with his PCP in [place] this week. CM will get him a lyft to the bus station. Malachi Carl, ACM-SW

## 2017-10-12 NOTE — Progress Notes (Signed)
CM notified of pt discharge today by primary CM, address verified- [place] [address], [place], [place] [id number]. CM set-up transport through [employer] for 1405. Primary CM notified.     Elnora MorrisonJesseca White RN, BSN  Care Management Department

## 2017-10-12 NOTE — Other (Signed)
Bedside shift change report given to Paul (oncoming nurse) by Thomas (offgoing nurse). Report included the following information SBAR, Kardex, Med Rec Status and Cardiac Rhythm NSR.

## 2017-10-12 NOTE — Progress Notes (Signed)
Problem: Suicide/Homicide (Adult/Pediatric)  Goal: *STG: Seeks staff when feelings of self harm or harm towards others arise  Outcome: Progressing Towards Goal  Goal: *STG: Attends activities and groups  Outcome: Progressing Towards Goal  Goal: *STG:  Verbalizes alternative ways of dealing with maladaptive feelings/behaviors  Outcome: Progressing Towards Goal  Goal: *STG/LTG: Complies with medication therapy  Outcome: Progressing Towards Goal  Goal: *STG/LTG:  No longer expresses self destructive or suicidal/homicidal thoughts  Outcome: Progressing Towards Goal  Goal: *LTG:  Identifies available community resources  Outcome: Progressing Towards Goal  Goal: Interventions  Outcome: Progressing Towards Goal     Problem: Falls - Risk of  Goal: *Absence of Falls  Description  Document William [name] Fall Risk and appropriate interventions in the flowsheet.  Outcome: Progressing Towards Goal

## 2017-10-12 NOTE — Procedures (Signed)
[facility]  EEG    Name:  [name], William  MR#:  [id number]  DOB:  12/17/1985  ACCOUNT #:  [id number]  DATE OF SERVICE:  10/11/2017      REQUESTING PROVIDER:  Kayin Osment S Latiffany Harwick, MD    HISTORY:  The patient is a 31-year-old male who is being evaluated after recent seizure-like activity.    DESCRIPTION:  This is an 18-channel EEG performed on an awake and drowsy patient.  During wakefulness, the dominant posterior background rhythm consists of symmetric, well modulated, medium voltage rhythms in the 8 to  9 Hz frequency range out of the posterior head region.  Drowsiness is characterized by slowing in vertex waves in the central area.  Intermittently, mild theta slowing was seen in both frontal temporal head regions.  Photic stimulation elicits symmetric driving response.  Hyperventilation was not performed.    EEG SUMMARY:  Mildly abnormal EEG due to mild intermittent slowing seen in both frontal temporal head regions, right slightly more than left.    CLINICAL INTERPRETATION:  This EEG shows mild focal slowing in the both frontal temporal areas which is a nonspecific finding.  No convincingly epileptiform features were noted.  No seizure was recorded.      Jamara Vary S Keyon Winnick, MD      [id number]  D:  10/12/2017 12:57  T:  10/12/2017 13:02  JOB #:  [id number]

## 2018-07-28 ENCOUNTER — Emergency Department (HOSPITAL_COMMUNITY)
Admission: EM | Admit: 2018-07-28 | Discharge: 2018-07-28 | Disposition: A | Discharge status: Home / Self Care | Payer: Self-pay

## 2018-07-28 NOTE — ED Notes (Signed)
Pt called x 3 times with no answer

## 2018-12-13 ENCOUNTER — Emergency Department (HOSPITAL_COMMUNITY): Payer: Self-pay

## 2018-12-13 ENCOUNTER — Other Ambulatory Visit: Payer: Self-pay

## 2018-12-13 ENCOUNTER — Encounter (HOSPITAL_COMMUNITY): Payer: Self-pay | Admitting: Emergency Medicine

## 2018-12-13 ENCOUNTER — Emergency Department (HOSPITAL_COMMUNITY)
Admission: EM | Admit: 2018-12-13 | Discharge: 2018-12-13 | Disposition: A | Discharge status: Home / Self Care | Payer: Self-pay | Attending: Emergency Medicine | Admitting: Emergency Medicine

## 2018-12-13 DIAGNOSIS — Y9389 Activity, other specified: Secondary | ICD-10-CM | POA: Insufficient documentation

## 2018-12-13 DIAGNOSIS — Y9289 Other specified places as the place of occurrence of the external cause: Secondary | ICD-10-CM | POA: Insufficient documentation

## 2018-12-13 DIAGNOSIS — S0990XA Unspecified injury of head, initial encounter: Secondary | ICD-10-CM | POA: Insufficient documentation

## 2018-12-13 DIAGNOSIS — W19XXXA Unspecified fall, initial encounter: Secondary | ICD-10-CM | POA: Insufficient documentation

## 2018-12-13 DIAGNOSIS — T07XXXA Unspecified multiple injuries, initial encounter: Secondary | ICD-10-CM

## 2018-12-13 DIAGNOSIS — T148XXA Other injury of unspecified body region, initial encounter: Secondary | ICD-10-CM

## 2018-12-13 DIAGNOSIS — F17210 Nicotine dependence, cigarettes, uncomplicated: Secondary | ICD-10-CM | POA: Insufficient documentation

## 2018-12-13 DIAGNOSIS — M25561 Pain in right knee: Secondary | ICD-10-CM | POA: Insufficient documentation

## 2018-12-13 DIAGNOSIS — Y998 Other external cause status: Secondary | ICD-10-CM | POA: Insufficient documentation

## 2018-12-13 DIAGNOSIS — S300XXA Contusion of lower back and pelvis, initial encounter: Secondary | ICD-10-CM | POA: Insufficient documentation

## 2018-12-13 DIAGNOSIS — R112 Nausea with vomiting, unspecified: Secondary | ICD-10-CM | POA: Insufficient documentation

## 2018-12-13 MED ORDER — TRAMADOL HCL 50 MG PO TABS: 50.0000 mg | ORAL_TABLET | Freq: Four times a day (QID) | ORAL | 0 refills | Status: AC | PRN

## 2018-12-13 MED ORDER — CYCLOBENZAPRINE HCL 10 MG PO TABS: 10.0000 mg | ORAL_TABLET | Freq: Three times a day (TID) | ORAL | 0 refills | Status: AC

## 2018-12-13 MED ORDER — PROMETHAZINE HCL 12.5 MG PO TABS
12.5000 mg | ORAL_TABLET | Freq: Once | ORAL | Status: AC
  Administered 2018-12-13: 22:00:00 12.5 mg via ORAL
  Filled 2018-12-13: qty 1

## 2018-12-13 MED ORDER — KETOROLAC TROMETHAMINE 30 MG/ML IJ SOLN
30.0000 mg | Freq: Once | INTRAMUSCULAR | Status: AC
  Administered 2018-12-13: 30 mg via INTRAMUSCULAR
  Filled 2018-12-13: qty 1

## 2018-12-13 MED ORDER — CYCLOBENZAPRINE HCL 10 MG PO TABS
10.0000 mg | ORAL_TABLET | Freq: Once | ORAL | Status: AC
  Administered 2018-12-13: 20:00:00 10 mg via ORAL
  Filled 2018-12-13: qty 1

## 2018-12-13 MED ORDER — HYDROCODONE-ACETAMINOPHEN 5-325 MG PO TABS
2.0000 | ORAL_TABLET | Freq: Once | ORAL | Status: AC
  Administered 2018-12-13: 20:00:00 2 via ORAL
  Filled 2018-12-13: qty 2

## 2018-12-13 MED ORDER — PROMETHAZINE HCL 12.5 MG PO TABS
12.5000 mg | ORAL_TABLET | Freq: Once | ORAL | Status: AC
  Administered 2018-12-13: 20:00:00 12.5 mg via ORAL
  Filled 2018-12-13: qty 1

## 2018-12-13 NOTE — ED Triage Notes (Signed)
Pt C/o fall after picking up a stepping stone 2 days ago. Pt states since that time he has pain that starts at his lower back and goes down the back of his right leg. Pt describes the pain as tingling.

## 2018-12-13 NOTE — ED Notes (Signed)
Back from CT

## 2018-12-13 NOTE — ED Notes (Signed)
Pt assisted to bathroom via wheelchair and back to bed with some assistance

## 2018-12-13 NOTE — Discharge Instructions (Addendum)
The CT scan of your lumbar spine shows no spine impingement, and no compression fractures.  The CT scan of your head and neck are negative for intracranial abnormalities or fracture or dislocation.  The x-rays of your ribs and chest are negative for fracture, or complications involving your lungs.  The x-ray of your hip and thigh area as well as your pelvis are also negative for fracture or dislocation.  You have some areas of muscle spasm particularly on the right lower back area and extending toward the buttocks area.  Heating pad to this area may be helpful.  Please use 600 mg of ibuprofen with breakfast, lunch, dinner, and at bedtime.  Please use Flexeril 3 times daily with food. Use ultram every 6 hours for more severe pain.  Flexeril and Ultram may cause drowsiness.  Please do not drive a vehicle, operate machinery, or participate in activities requiring concentration when taking either these medications.

## 2018-12-13 NOTE — ED Notes (Signed)
ASO applied, pt stated he has used crutches before; pt discharged via wheelchair

## 2018-12-13 NOTE — ED Provider Notes (Signed)
[facility] EMERGENCY DEPARTMENT Provider Note   CSN: [id number] Arrival date & time: 12/13/18  1733    History   Chief Complaint Chief Complaint  Patient presents with  . Fall    HPI Roberto Glover is a 33 y.o. male.     Patient is a 33 year old male who presents to the emergency department with a complaint of pain in multiple areas following a fall.  The patient states that 2 days ago he was picking up stepping stones in his yard, he stumbled, fell, and injured his lower back, his right rib area, and thinks he may have also hit his head.  The patient is unsure of any loss of consciousness.  He says he has had problems with headaches since the fall.  Patient is also had some nausea and vomiting since the fall as well.  The patient denies loss of control of bowel or bladder.  He states however that he has some numb sensation from his lower back down the right leg down to his foot and ankle area.  He denies loss of sensation in the saddle area.  He only complains of a numb sensation down the back of the right leg.  Patient states that he has been sleeping on his couch with his leg elevated.  He says that he had swelling of the right ankle  The history is provided by the patient.    Past Medical History:  Diagnosis Date  . RSD (reflex sympathetic dystrophy)   . Seizures ([place])     There are no active problems to display for this patient.   History reviewed. No pertinent surgical history.      Home Medications    Prior to Admission medications   Medication Sig Start Date End Date Taking? Authorizing Provider  cetirizine (ZYRTEC) 10 MG tablet Take 10 mg by mouth daily as needed for allergies.    [provider]  levETIRAcetam (KEPPRA) 750 MG tablet Take 1 tablet (750 mg total) by mouth 2 (two) times daily for 15 days. 07/26/17 08/10/17  Kinnie Feil, PA-C    Family History No family history on file.  Social History Social History   Tobacco Use  . Smoking  status: Current Every Day Smoker    Packs/day: 0.50    Types: Cigarettes  . Smokeless tobacco: Never Used  Substance Use Topics  . Alcohol use: Yes    Comment: occ  . Drug use: No     Allergies   Patient has no known allergies.   Review of Systems Review of Systems   Physical Exam Updated Vital Signs BP (!) 146/97   Pulse 83   Temp 98 F (36.7 C) (Oral)   SpO2 98%   Physical Exam HENT:     Head: Abrasion present.   Pulmonary:     Comments: Patient speaks in complete sentences without problem.  There is symmetrical rise and fall of the chest. Chest:     Chest wall: Tenderness present.  Musculoskeletal:     Right hip: He exhibits tenderness.     Lumbar back: He exhibits pain.       Back:       Legs:     Comments: Pain with attempted range of motion of the right hip.  No evidence of dislocation.  Patient has pain of the right knee, but he states he has chronic right  knee problems, and he is unsure how much of this may be related to the fall 2  days ago.  Capillary refill of the right  lower extremity is less than 2 seconds.  Dorsalis pedis pulse and posterior tibial pulses are 2+.  The Achilles tendon is intact.  There is no significant swelling of the lateral or medial malleolus at this time..  Neurological:     GCS: GCS eye subscore is 4. GCS verbal subscore is 5. GCS motor subscore is 6.     Cranial Nerves: Cranial nerves are intact.     Comments: Patient states he can feel me touch his left lower extremity, but it has a numb sensation particularly at the posterior portion and then at the left ankle and left foot.  Gait not tested due to pain of the back and hip area.      ED Treatments / Results  Labs (all labs ordered are listed, but only abnormal results are displayed) Labs Reviewed - No data to display  EKG None  Radiology No results found.  Procedures Procedures (including critical care time)  Medications Ordered in ED Medications   cyclobenzaprine (FLEXERIL) tablet 10 mg (has no administration in time range)  HYDROcodone-acetaminophen (NORCO/VICODIN) 5-325 MG per tablet 2 tablet (has no administration in time range)  promethazine (PHENERGAN) tablet 12.5 mg (has no administration in time range)     Initial Impression / Assessment and Plan / ED Course  I have reviewed the triage vital signs and the nursing notes.  Pertinent labs & imaging results that were available during my care of the patient were reviewed by me and considered in my medical decision making (see chart for details).          Final Clinical Impressions(s) / ED Diagnoses mdm  Vital signs reviewed.  Pulse oximetry is 98% on room air.  Within normal limits by my interpretation. Patient has pain at multiple sites.  He speaks in complete sentences without problem. Patient complains of a tingling pain from his lower back down the back of his leg to the foot and ankle area on the right.  Medication for pain has been given to the patient.  The patient has an abrasion to the side of the head, he is not sure if he hit his head when he had the fall or not.  A CT scan of the head shows no acute intracranial abnormality.  CT scan of the cervical spine shows no fracture or dislocation. CT scan of the lumbar spine show some mild loss of the lordotic curve, otherwise no fracture or dislocation.  No neuronal impingement noted by CT scan.  X-ray of the right ribs and chest shows no fracture or dislocation.  No evidence of injury to the lungs.  X-ray of the right hip and pelvis shows no hip fracture no dislocation and no acute pelvis abnormality.  I have discussed all the findings with the patient as well as the plan at this time to use an ankle stirrup splint to the right ankle for probable sprain.  I have also ordered the patient crutches so that he does not have to put weight on the right lower extremity as he says this causes him a great deal of pain and  discomfort.  The patient states that the 10 mg of hydrocodone the 10 mg of Flexeril that he was given earlier has not touched his pain.  Patient will be given an injection of Toradol to assist with his pain.  I have asked the patient to follow-up with his primary physician.  Have asked him to use  a heating pad to the area when at rest and to use his crutches.  I have asked the patient to use 600 mg of ibuprofen with breakfast, lunch, dinner, and at bedtime.  I have also asked the patient to use Flexeril 3 times daily.  The patient has been given a short course of Ultram to use for more severe pain.  Patient is to follow-up with the primary physician for additional evaluation and management.   Final diagnoses:  Fall, initial encounter  Contusion, multiple sites  Muscle strain    ED Discharge Orders         Ordered    cyclobenzaprine (FLEXERIL) 10 MG tablet  3 times daily     12/13/18 2149    traMADol (ULTRAM) 50 MG tablet  Every 6 hours PRN     12/13/18 2149           Ivery QualeBryant, Kennith Morss, PA-C 12/13/18 2218    Jacalyn LefevreHaviland, Julie, MD 12/14/18 [phone]

## 2021-03-28 IMAGING — CT CT LUMBAR SPINE WITHOUT CONTRAST
3 of 6 series · 9 of 33 positions shown, 10 images · non-contrast
Comparison: CT Chest, Abdomen, and Pelvis 09/13/2017

CLINICAL DATA: 33-year-old male status post fall 2 days ago with
continued low back pain radiating to the right leg.

EXAM:
CT LUMBAR SPINE WITHOUT CONTRAST
TECHNIQUE: Multidetector CT imaging of the lumbar spine was performed without
intravenous contrast administration. Multiplanar CT image
reconstructions were also generated.

[Series 6: l spine soft · axial · 0.31mm/px · z∈[+948,+1124]mm · 3 of 138 slices shown, 4 images]
[im 25/138  soft-tissue]
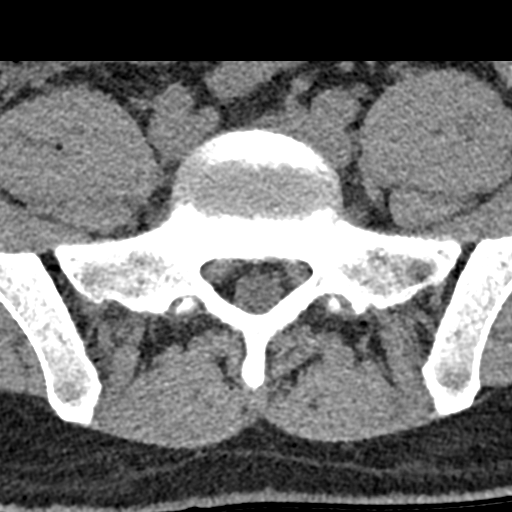
[im 25/138  bone]
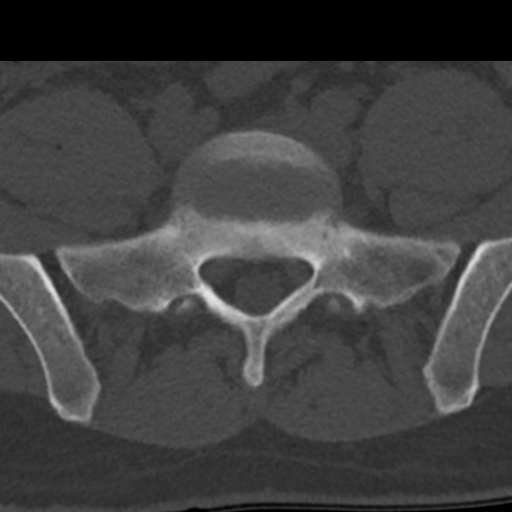
[im 75/138  bone]
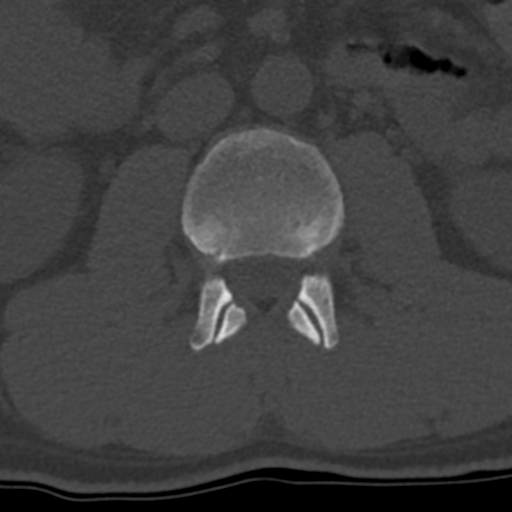
[im 113/138  bone]
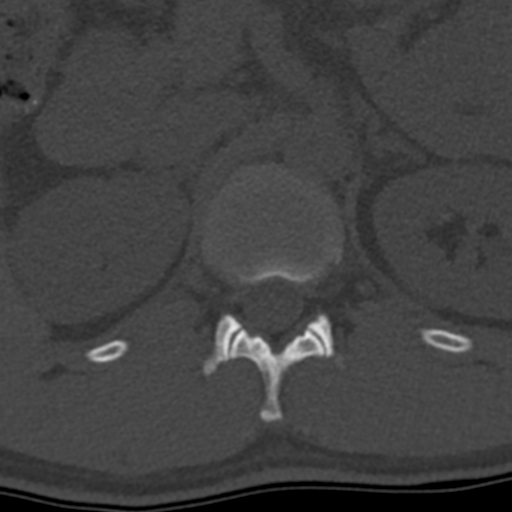

[Series 9: sagittal bone · sagittal · 0.11mm/px · 5 of 96 slices shown]
[im 16/96  bone]
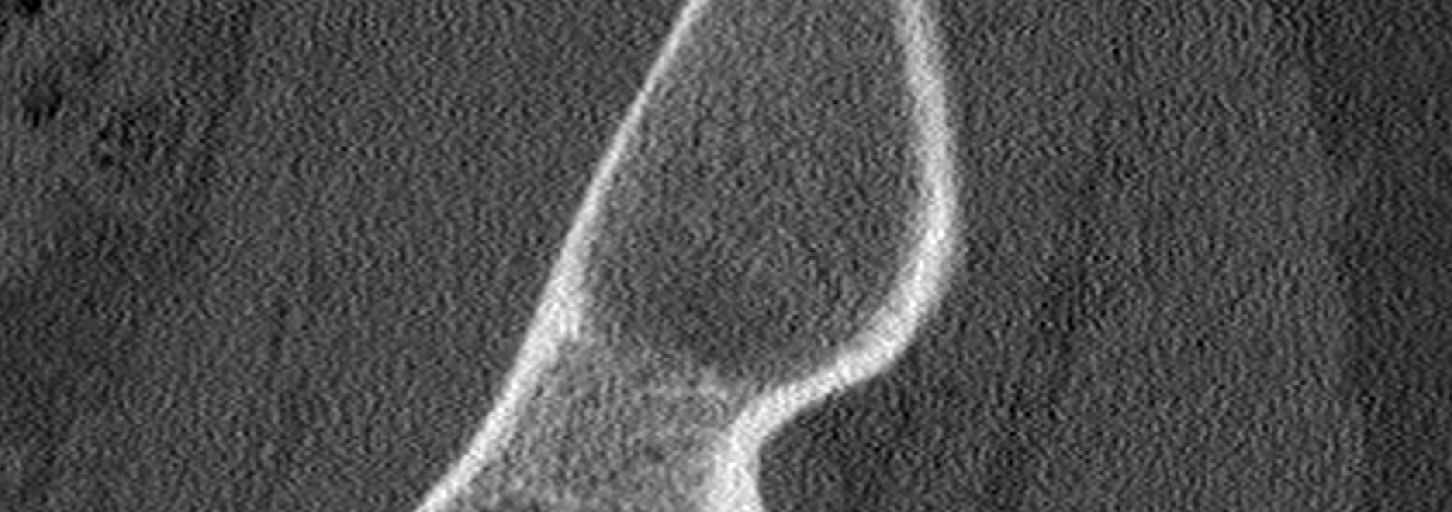
[im 32/96  bone]
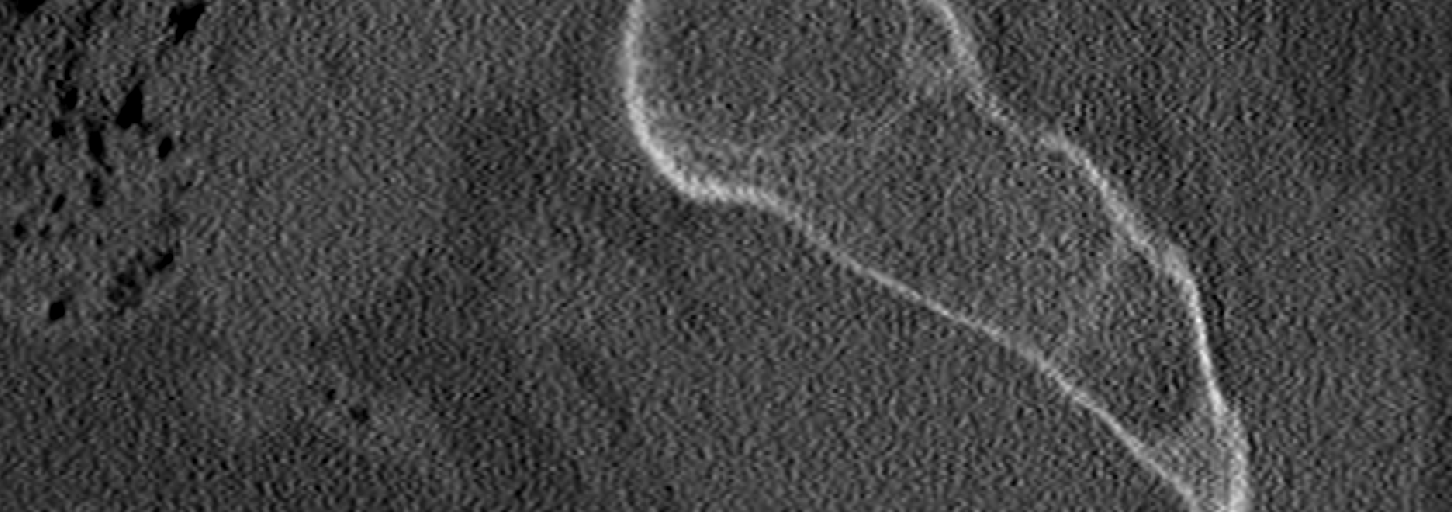
[im 48/96  bone]
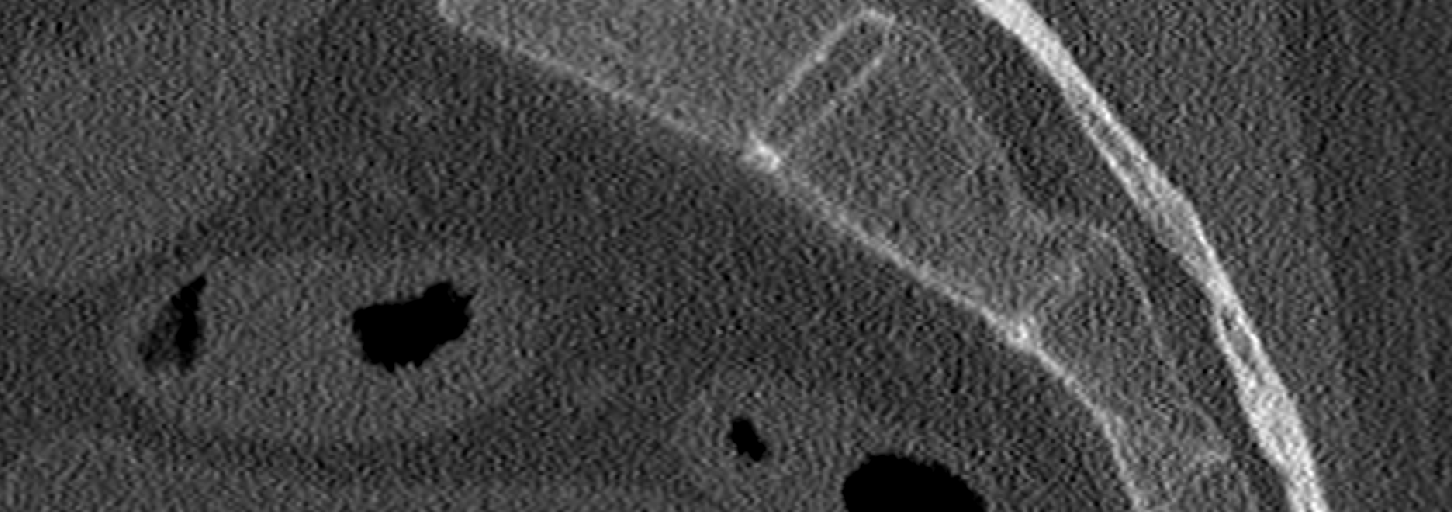
[im 64/96  bone]
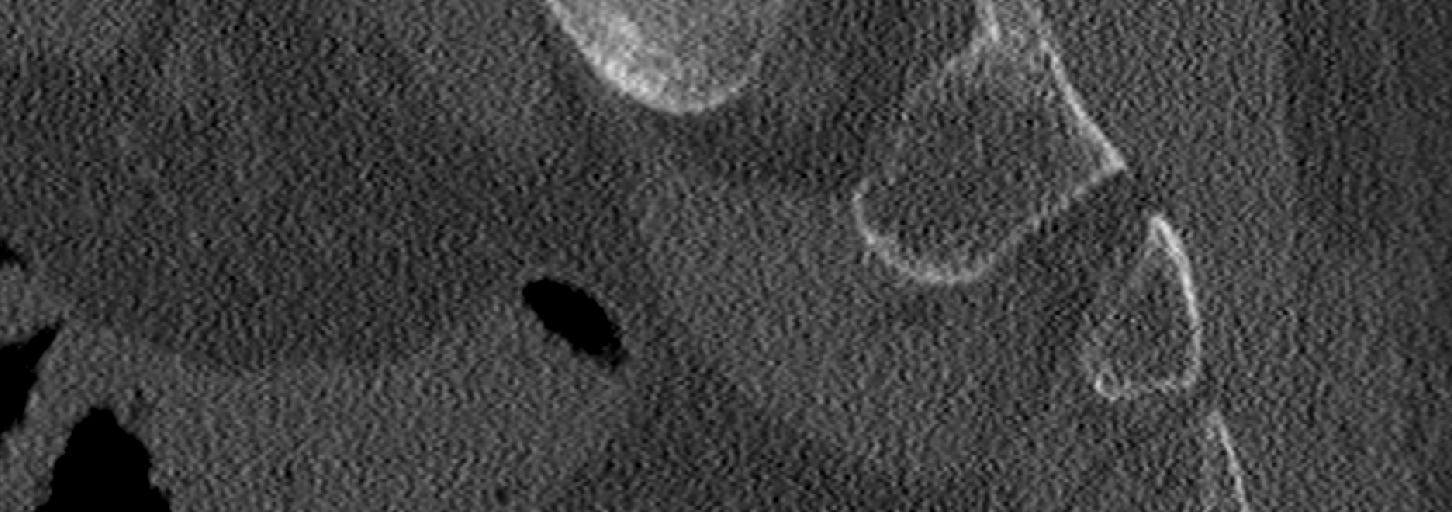
[im 80/96  bone]
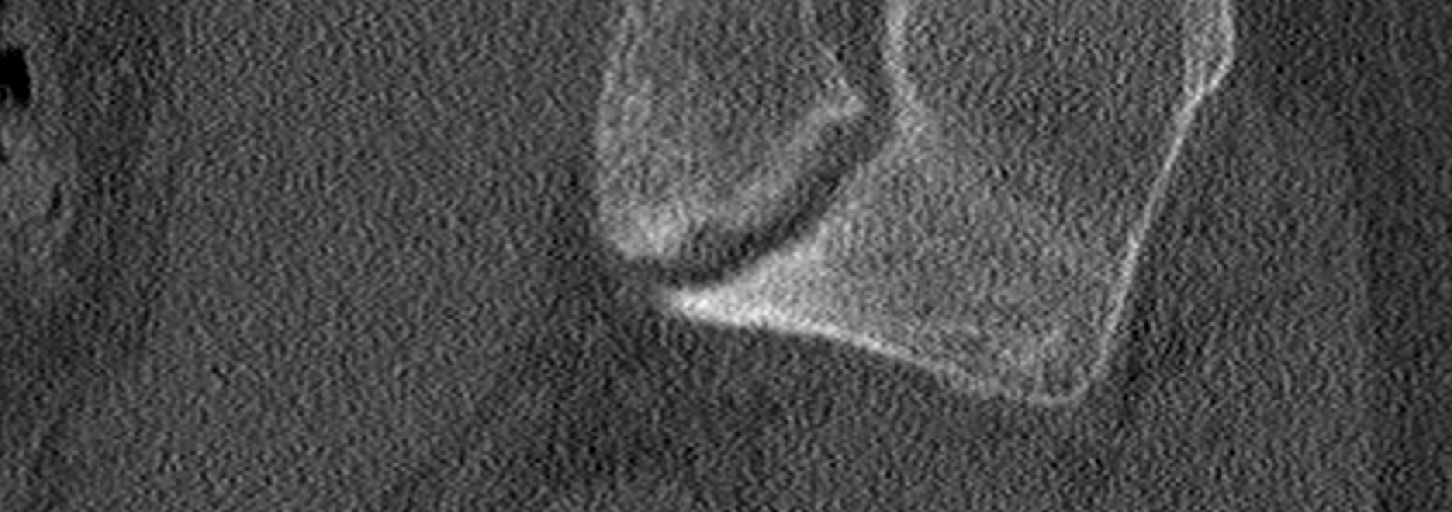

[Series 10: coronal bone · coronal · 0.42mm/px · 1 of 84 slices shown]
[im 42/84  bone]
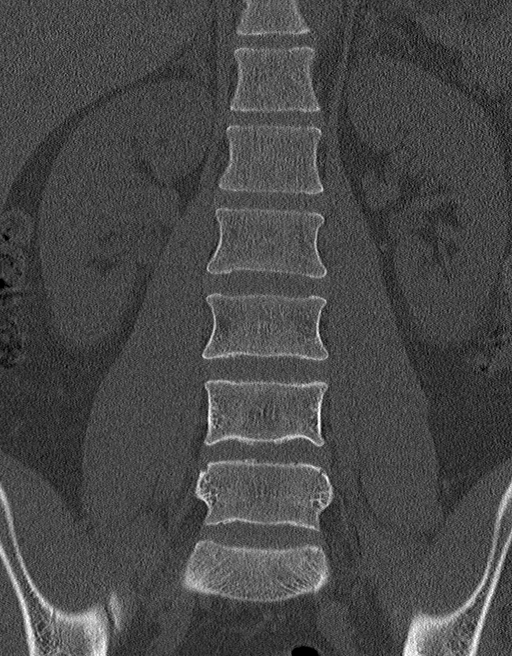

[9 of 33 positions shown; findings below may reference images not displayed]

FINDINGS: Segmentation: Normal.

Alignment: Stable straightening of lumbar lordosis since 2724. No
spondylolisthesis.

Vertebrae: Bone mineralization is within normal limits. No acute
osseous abnormality identified. Intact visible sacrum and SI joints.

Paraspinal and other soft tissues: Negative. Normal appendix is
visible.

Disc levels: T11-T12: Negative.

T12-L1:  Negative.

L1-L2:  Negative.

L2-L3: Minimal disc bulge eccentric to the right (series 6, image
61), but no stenosis.

L3-L4:  Minimal disc bulge. Otherwise negative.

L4-L5: Mild disc bulge eccentric to the left with mild endplate
spurring. Mild left L4 foraminal stenosis. No convincing right side
neural impingement.

L5-S1:  Negative.
IMPRESSION: No osseous abnormality in the lumbar spine. Mild disc bulging but no
convincing right side neural impingement by CT.
